# Patient Record
Sex: Male | Born: 1975 | Race: Black or African American | Hispanic: No | Marital: Single | Smoking: Current every day smoker
Health system: Southern US, Community
[De-identification: ages and names within clinical notes are randomized; demographics above are authoritative.]

## PROBLEM LIST (undated history)

## (undated) DIAGNOSIS — K4090 Unilateral inguinal hernia, without obstruction or gangrene, not specified as recurrent: Secondary | ICD-10-CM

## (undated) DIAGNOSIS — F17200 Nicotine dependence, unspecified, uncomplicated: Secondary | ICD-10-CM

## (undated) DIAGNOSIS — F1910 Other psychoactive substance abuse, uncomplicated: Secondary | ICD-10-CM

## (undated) DIAGNOSIS — IMO0001 Reserved for inherently not codable concepts without codable children: Secondary | ICD-10-CM

## (undated) HISTORY — PX: HERNIA REPAIR: SHX51

---

## 2008-11-10 ENCOUNTER — Emergency Department: Payer: Self-pay | Admitting: Emergency Medicine

## 2008-11-12 ENCOUNTER — Emergency Department: Payer: Self-pay | Admitting: Internal Medicine

## 2011-11-07 ENCOUNTER — Emergency Department: Payer: Self-pay | Admitting: Emergency Medicine

## 2019-12-29 ENCOUNTER — Emergency Department (HOSPITAL_COMMUNITY): Admission: EM | Admit: 2019-12-29 | Discharge: 2019-12-29 | Discharge status: Absent without leave | Payer: Self-pay

## 2019-12-29 ENCOUNTER — Other Ambulatory Visit: Payer: Self-pay

## 2020-08-15 ENCOUNTER — Other Ambulatory Visit: Payer: Self-pay

## 2020-08-15 ENCOUNTER — Emergency Department
Admission: EM | Admit: 2020-08-15 | Discharge: 2020-08-15 | Disposition: A | Discharge status: Home / Self Care | Payer: Self-pay | Attending: Emergency Medicine | Admitting: Emergency Medicine

## 2020-08-15 DIAGNOSIS — J111 Influenza due to unidentified influenza virus with other respiratory manifestations: Secondary | ICD-10-CM | POA: Insufficient documentation

## 2020-08-15 DIAGNOSIS — F17210 Nicotine dependence, cigarettes, uncomplicated: Secondary | ICD-10-CM | POA: Insufficient documentation

## 2020-08-15 DIAGNOSIS — Z20822 Contact with and (suspected) exposure to covid-19: Secondary | ICD-10-CM | POA: Insufficient documentation

## 2020-08-15 LAB — RESP PANEL BY RT-PCR (FLU A&B, COVID) ARPGX2
Influenza A by PCR: POSITIVE — AB
Influenza B by PCR: NEGATIVE
SARS Coronavirus 2 by RT PCR: NEGATIVE

## 2020-08-15 NOTE — ED Triage Notes (Signed)
Pt states the people who he lives with all tested positive for the flu, pt started to have body aches and congestion this morning. Pt denies pain or sob.

## 2020-08-15 NOTE — Discharge Instructions (Signed)
Take over-the-counter Tylenol or Motrin to help manage any fevers and body aches.  Take cough and allergy medicines as needed.  Follow-up local urgent care or community center for ongoing symptoms.

## 2020-08-15 NOTE — ED Provider Notes (Signed)
[facility] Emergency Department Provider Note ____________________________________________  Time seen: 2015  I have reviewed the triage vital signs and the nursing notes.  HISTORY  Chief Complaint  Influenza  HPI Tommy Hunt is a 45 y.o. male presents himself to the ED for evaluation of flulike symptoms.  Patient is home with several housemates who have tested positive for the flu.  Patient arrives complaining of body aches and congestion with onset this morning.  He denies any chest pain or shortness of breath.   History reviewed. No pertinent past medical history.  There are no problems to display for this patient.  History reviewed. No pertinent surgical history.  Prior to Admission medications   Not on File    Allergies Patient has no allergy information on record.  History reviewed. No pertinent family history.  Social History Social History   Tobacco Use  . Smoking status: Current Every Day Smoker    Types: Cigarettes  . Smokeless tobacco: Never Used  Substance Use Topics  . Alcohol use: Yes    Comment: occ  . Drug use: Yes    Types: Marijuana    Review of Systems  Constitutional: Negative for fever. Eyes: Negative for visual changes. ENT: Negative for sore throat.  Nasal congestion as above. Cardiovascular: Negative for chest pain. Respiratory: Negative for shortness of breath. Gastrointestinal: Negative for abdominal pain, vomiting and diarrhea. Genitourinary: Negative for dysuria. Musculoskeletal: Negative for back pain.  Reports generalized body aches. Skin: Negative for rash. Neurological: Negative for headaches, focal weakness or numbness. ____________________________________________  PHYSICAL EXAM:  VITAL SIGNS: ED Triage Vitals  Enc Vitals Group     BP 08/15/20 2005 (!) 176/83     Pulse Rate 08/15/20 2005 83     Resp 08/15/20 2005 18     Temp 08/15/20 2005 99.3 F (37.4 C)     Temp Source 08/15/20 2005 Oral      SpO2 08/15/20 2005 95 %     Weight 08/15/20 2003 200 lb (90.7 kg)     Height 08/15/20 2003 6' (1.829 m)     Head Circumference --      Peak Flow --      Pain Score 08/15/20 2003 0     Pain Loc --      Pain Edu? --      Excl. in GC? --     Constitutional: Alert and oriented. Well appearing and in no distress. Head: Normocephalic and atraumatic. Eyes: Conjunctivae are normal. Normal extraocular movements Cardiovascular: Normal rate, regular rhythm. Normal distal pulses. Respiratory: Normal respiratory effort. No wheezes/rales/rhonchi. Gastrointestinal: Soft and nontender. No distention. Musculoskeletal: Nontender with normal range of motion in all extremities.  Neurologic:  Normal gait without ataxia. Normal speech and language. No gross focal neurologic deficits are appreciated. Skin:  Skin is warm, dry and intact. No rash noted. Psychiatric: Mood and affect are normal. Patient exhibits appropriate insight and judgment. ____________________________________________   LABS (pertinent positives/negatives)  Labs Reviewed  RESP PANEL BY RT-PCR (FLU A&B, COVID) ARPGX2 - Abnormal; Notable for the following components:      Result Value   Influenza A by PCR POSITIVE (*)    All other components within normal limits  ____________________________________________  PROCEDURES  Procedures ____________________________________________   INITIAL IMPRESSION / ASSESSMENT AND PLAN / ED COURSE  As part of my medical decision making, I reviewed the following data within the electronic MEDICAL RECORD NUMBER Labs reviewed influenza A -positive and Notes from prior ED visits  Patient  with ED evaluation of symptoms concerning for influenza.  He presented last night with his housemates, who are all Conty positive for influenza.  He noted onset of his symptoms last night.  He describes body aches and chills.  He was evaluated for his complaints at this time and found to have a positive influenza screen.  He  is encounter treated symptoms with over-the-counter antipyretics and cough medicines as needed.  He is declined prescription for Tamiflu at this time.  Return precautions have been discussed.  Tommy Hunt was evaluated in Emergency Department on 08/15/2020 for the symptoms described in the history of present illness. He was evaluated in the context of the global COVID-19 pandemic, which necessitated consideration that the patient might be at risk for infection with the SARS-CoV-2 virus that causes COVID-19. Institutional protocols and algorithms that pertain to the evaluation of patients at risk for COVID-19 are in a state of rapid change based on information released by regulatory bodies including the CDC and federal and state organizations. These policies and algorithms were followed during the patient's care in the ED. ____________________________________________  FINAL CLINICAL IMPRESSION(S) / ED DIAGNOSES  Final diagnoses:  Influenza      Karmen Stabs, Charlesetta Ivory, PA-C 08/15/20 2111    Phineas Semen, MD 08/15/20 2125

## 2020-11-27 ENCOUNTER — Other Ambulatory Visit: Payer: Self-pay

## 2020-11-27 ENCOUNTER — Emergency Department: Payer: Self-pay

## 2020-11-27 ENCOUNTER — Emergency Department
Admission: EM | Admit: 2020-11-27 | Discharge: 2020-11-27 | Disposition: A | Discharge status: Home / Self Care | Payer: Self-pay | Attending: Emergency Medicine | Admitting: Emergency Medicine

## 2020-11-27 DIAGNOSIS — R062 Wheezing: Secondary | ICD-10-CM | POA: Insufficient documentation

## 2020-11-27 DIAGNOSIS — F17210 Nicotine dependence, cigarettes, uncomplicated: Secondary | ICD-10-CM | POA: Insufficient documentation

## 2020-11-27 DIAGNOSIS — R0602 Shortness of breath: Secondary | ICD-10-CM | POA: Insufficient documentation

## 2020-11-27 DIAGNOSIS — R079 Chest pain, unspecified: Secondary | ICD-10-CM | POA: Insufficient documentation

## 2020-11-27 LAB — BASIC METABOLIC PANEL
Anion gap: 6 (ref 5–15)
BUN: 16 mg/dL (ref 6–20)
CO2: 28 mmol/L (ref 22–32)
Calcium: 9 mg/dL (ref 8.9–10.3)
Chloride: 107 mmol/L (ref 98–111)
Creatinine, Ser: 1.07 mg/dL (ref 0.61–1.24)
GFR, Estimated: >60 mL/min (ref >60)
Glucose, Bld: 88 mg/dL (ref 70–99)
Potassium: 3.9 mmol/L (ref 3.5–5.1)
Sodium: 141 mmol/L (ref 135–145)

## 2020-11-27 LAB — CBC
HCT: 45.2 % (ref 39.0–52.0)
Hemoglobin: 15.3 g/dL (ref 13.0–17.0)
MCH: 30.2 pg (ref 26.0–34.0)
MCHC: 33.8 g/dL (ref 30.0–36.0)
MCV: 89.2 fL (ref 80.0–100.0)
Platelets: 187 10*3/uL (ref 150–400)
RBC: 5.07 MIL/uL (ref 4.22–5.81)
RDW: 14.1 % (ref 11.5–15.5)
WBC: 7.9 10*3/uL (ref 4.0–10.5)
nRBC: 0 % (ref 0.0–0.2)

## 2020-11-27 LAB — TROPONIN I (HIGH SENSITIVITY): Troponin I (High Sensitivity): 3 ng/L (ref <18)

## 2020-11-27 MED ORDER — ALBUTEROL SULFATE HFA 108 (90 BASE) MCG/ACT IN AERS: 1.0000 | INHALATION_SPRAY | RESPIRATORY_TRACT | 2 refills | Status: AC | PRN

## 2020-11-27 MED ORDER — IPRATROPIUM-ALBUTEROL 0.5-2.5 (3) MG/3ML IN SOLN
3.0000 mL | Freq: Once | RESPIRATORY_TRACT | Status: AC
  Administered 2020-11-27: 3 mL via RESPIRATORY_TRACT
  Filled 2020-11-27: qty 3

## 2020-11-27 MED ORDER — ALBUTEROL SULFATE (2.5 MG/3ML) 0.083% IN NEBU: 3.0000 mL | INHALATION_SOLUTION | Freq: Once | RESPIRATORY_TRACT | Status: DC

## 2020-11-27 MED ORDER — PREDNISONE 50 MG PO TABS: 50.0000 mg | ORAL_TABLET | Freq: Every day | ORAL | 0 refills | Status: AC

## 2020-11-27 MED ORDER — ALBUTEROL SULFATE HFA 108 (90 BASE) MCG/ACT IN AERS: 1.0000 | INHALATION_SPRAY | Freq: Once | RESPIRATORY_TRACT | Status: DC

## 2020-11-27 MED ORDER — ALBUTEROL SULFATE HFA 108 (90 BASE) MCG/ACT IN AERS
1.0000 | INHALATION_SPRAY | Freq: Once | RESPIRATORY_TRACT | Status: DC
  Filled 2020-11-27: qty 6.7

## 2020-11-27 MED ORDER — PREDNISONE 20 MG PO TABS
60.0000 mg | ORAL_TABLET | Freq: Once | ORAL | Status: AC
  Administered 2020-11-27: 60 mg via ORAL
  Filled 2020-11-27: qty 3

## 2020-11-27 NOTE — Discharge Instructions (Signed)
Please take the prednisone for the next 5 days.  You can use the albuterol inhaler every 4 hours as needed.  If your breathing is worsening, please return to the emergency department.  You likely have COPD, you should follow-up with your primary care provider or at one of the health clinics for further work-up and management.

## 2020-11-27 NOTE — ED Provider Notes (Signed)
[facility]  ____________________________________________   Event Date/Time   First MD Initiated Contact with Patient 11/27/20 1115     (approximate)  I have reviewed the triage vital signs and the nursing notes.   HISTORY  Chief Complaint Chest Pain and Shortness of Breath    HPI Basil Buffin is a 45 y.o. male history of heavy tobacco use who presents with shortness of breath and wheezing.  Patient woke up today around 3 AM and felt short of breath and felt he was wheezing.  He has had cough since then as well as congestion.  Prior to this was feeling okay.  Denies associated chest pain.  He has no prior history of COPD or asthma, but says he has used inhalers in the past but not currently.  Denies fevers or chills.  No sick exposures. The patient denies hx of prior DVT/PE, unilateral leg pain/swelling, hormone use, recent surgery, hx of cancer, prolonged immobilization, or hemoptysis.           History reviewed. No pertinent past medical history.  There are no problems to display for this patient.   History reviewed. No pertinent surgical history.  Prior to Admission medications   Medication Sig Start Date End Date Taking? Authorizing Provider  albuterol (VENTOLIN HFA) 108 (90 Base) MCG/ACT inhaler Inhale 1-2 puffs into the lungs every 4 (four) hours as needed for wheezing or shortness of breath. 11/27/20  Yes Georga Hacking, MD  predniSONE (DELTASONE) 50 MG tablet Take 1 tablet (50 mg total) by mouth daily for 5 days. 11/27/20 12/02/20 Yes Georga Hacking, MD    Allergies Patient has no allergy information on record.  No family history on file.  Social History Social History   Tobacco Use   Smoking status: Every Day    Types: Cigarettes   Smokeless tobacco: Never  Substance Use Topics   Alcohol use: Yes    Comment: occ   Drug use: Yes    Types: Marijuana    Review of Systems   Review of Systems  Constitutional:  Negative for  chills and fever.  Respiratory:  Positive for cough, shortness of breath and wheezing. Negative for chest tightness.   Cardiovascular:  Negative for chest pain, palpitations and leg swelling.  Gastrointestinal:  Negative for abdominal pain and vomiting.  All other systems reviewed and are negative.  Physical Exam Updated Vital Signs BP 123/82 (BP Location: Left Arm)   Pulse 74   Temp 98.5 F (36.9 C)   Resp 20   Ht 6' (1.829 m)   Wt 88.5 kg   SpO2 97%   BMI 26.45 kg/m   Physical Exam Vitals and nursing note reviewed.  Constitutional:      General: He is not in acute distress.    Appearance: Normal appearance.  HENT:     Head: Normocephalic and atraumatic.  Eyes:     General: No scleral icterus.    Conjunctiva/sclera: Conjunctivae normal.  Pulmonary:     Effort: Pulmonary effort is normal. No respiratory distress.     Breath sounds: Normal breath sounds. No wheezing.     Comments: Patient with expiratory wheezing, good air movement, no respiratory distress Musculoskeletal:        General: No deformity or signs of injury.     Cervical back: Normal range of motion.     Right lower leg: No edema.     Left lower leg: No edema.  Skin:    Coloration: Skin is  not jaundiced or pale.  Neurological:     General: No focal deficit present.     Mental Status: He is alert and oriented to person, place, and time. Mental status is at baseline.  Psychiatric:        Mood and Affect: Mood normal.        Behavior: Behavior normal.     LABS (all labs ordered are listed, but only abnormal results are displayed)  Labs Reviewed  BASIC METABOLIC PANEL  CBC  TROPONIN I (HIGH SENSITIVITY)   ____________________________________________  EKG  Rightward axis, normal sinus rhythm, normal intervals, no ST or T wave changes ____________________________________________  RADIOLOGY Ky Barban, personally viewed and evaluated these images (plain radiographs) as part of my medical  decision making, as well as reviewing the written report by the radiologist.  ED MD interpretation: I reviewed the chest x-ray which does not show any acute cardiopulmonary process    ____________________________________________   PROCEDURES  Procedure(s) performed (including Critical Care):  Procedures   ____________________________________________   INITIAL IMPRESSION / ASSESSMENT AND PLAN / ED COURSE     Patient is a 45 year old male with a history of tobacco use but no diagnosis of COPD or asthma who presents with shortness of breath and wheezing.  Onset today.  Vital signs within normal limits.  On my exam he is not in any respiratory distress, does have expiratory wheezing with good air movement.  Chest x-ray does not show any pneumonia or other in process.  His EKG is nonischemic.  Labs are reassuring with normal troponin.  Suspect that he has an underlying diagnosis of COPD given his tobacco use.  Will treat as COPD exacerbation.  Patient given duo nebs x2 and prednisone.  On repeat evaluation patient feeling much improved, no longer wheezing.  Will discharge with 5 days of prednisone and albuterol inhaler in hand.  We discussed following up with primary care provider or at one of the clinics as he likely has a diagnosis of COPD and should have follow-up for this.  Return precautions discussed as well.      ____________________________________________   FINAL CLINICAL IMPRESSION(S) / ED DIAGNOSES  Final diagnoses:  Wheezing  Shortness of breath     ED Discharge Orders          Ordered    predniSONE (DELTASONE) 50 MG tablet  Daily        11/27/20 1257    albuterol (VENTOLIN HFA) 108 (90 Base) MCG/ACT inhaler  Every 4 hours PRN        11/27/20 1257             Note:  This document was prepared using Dragon voice recognition software and may include unintentional dictation errors.    Georga Hacking, MD 11/27/20 1302

## 2020-11-27 NOTE — ED Triage Notes (Signed)
Pt comes with c/o SOb and CP that started last night. Pt states he went to work and his SOB all started again. Pt states wheezing. Pt denies any hx of this.

## 2021-03-14 ENCOUNTER — Emergency Department (HOSPITAL_COMMUNITY)
Admission: EM | Admit: 2021-03-14 | Discharge: 2021-03-14 | Disposition: A | Discharge status: Home / Self Care | Payer: Self-pay | Attending: Emergency Medicine | Admitting: Emergency Medicine

## 2021-03-14 ENCOUNTER — Encounter (HOSPITAL_COMMUNITY): Payer: Self-pay | Admitting: *Deleted

## 2021-03-14 ENCOUNTER — Other Ambulatory Visit: Payer: Self-pay

## 2021-03-14 DIAGNOSIS — U071 COVID-19: Secondary | ICD-10-CM | POA: Insufficient documentation

## 2021-03-14 DIAGNOSIS — F17210 Nicotine dependence, cigarettes, uncomplicated: Secondary | ICD-10-CM | POA: Insufficient documentation

## 2021-03-14 LAB — RESP PANEL BY RT-PCR (FLU A&B, COVID) ARPGX2
Influenza A by PCR: NEGATIVE
Influenza B by PCR: NEGATIVE
SARS Coronavirus 2 by RT PCR: POSITIVE — AB

## 2021-03-14 MED ORDER — IBUPROFEN 400 MG PO TABS
400.0000 mg | ORAL_TABLET | Freq: Once | ORAL | Status: AC
  Administered 2021-03-14: 400 mg via ORAL
  Filled 2021-03-14: qty 1

## 2021-03-14 NOTE — Discharge Instructions (Signed)
You have tested positive for COVID and you will need to stay home to avoid spreading this infection through Sunday.  As long as you are feeling better and have had no fevers for 24 hours it will be okay for you to return to work on Monday.  Rest to make sure you are drinking plenty of fluids.  I recommend taking Tylenol or ibuprofen to help you with fever, body aches.  Return here if you develop any worsening symptoms including weakness, dizziness or shortness of breath.

## 2021-03-14 NOTE — ED Triage Notes (Signed)
Pt c/o body aches with chills that started  this am

## 2021-03-14 NOTE — ED Provider Notes (Signed)
[facility] EMERGENCY DEPARTMENT Provider Note   CSN: [id number] Arrival date & time: 03/14/21  1412     History Chief Complaint  Patient presents with   Generalized Body Aches    Tommy Hunt is a 45 y.o. male with no significant past medical history presenting with a 1 day history of flulike symptoms including generalized body aches, headache and clear nasal drainage.  He denies cough or shortness of breath, he has had subjective fever including chills.  He denies chest pain, nausea, vomiting or abdominal pain.  He has had no treatment prior to arrival but states he left work early and went home and took a nap and his headache has resolved but he still has body aches.  He has been fully COVID vaccinated but did not receive the flu shot this fall.  The history is provided by the patient.      History reviewed. No pertinent past medical history.  There are no problems to display for this patient.   Past Surgical History:  Procedure Laterality Date   HERNIA REPAIR         History reviewed. No pertinent family history.  Social History   Tobacco Use   Smoking status: Every Day    Packs/day: 0.50    Types: Cigarettes   Smokeless tobacco: Never  Substance Use Topics   Alcohol use: Yes    Comment: occ   Drug use: Yes    Types: Marijuana    Home Medications Prior to Admission medications   Medication Sig Start Date End Date Taking? Authorizing Provider  albuterol (VENTOLIN HFA) 108 (90 Base) MCG/ACT inhaler Inhale 1-2 puffs into the lungs every 4 (four) hours as needed for wheezing or shortness of breath. 11/27/20   Georga Hacking, MD    Allergies    Patient has no known allergies.  Review of Systems   Review of Systems  Constitutional:  Positive for chills and fever.  HENT:  Positive for rhinorrhea. Negative for congestion, ear pain, sinus pressure, sore throat, trouble swallowing and voice change.   Eyes:  Negative for discharge.  Respiratory:  Negative  for cough, shortness of breath, wheezing and stridor.   Cardiovascular:  Negative for chest pain.  Gastrointestinal:  Negative for abdominal pain, diarrhea, nausea and vomiting.  Genitourinary: Negative.   Musculoskeletal:  Positive for myalgias.  All other systems reviewed and are negative.  Physical Exam Updated Vital Signs BP 118/79 (BP Location: Right Arm)   Pulse 65   Temp 98.8 F (37.1 C)   Resp 15   Ht 6\' 1"  (1.854 m)   Wt 86.2 kg   SpO2 96%   BMI 25.07 kg/m   Physical Exam Vitals and nursing note reviewed.  Constitutional:      Appearance: He is well-developed.  HENT:     Head: Normocephalic and atraumatic.     Nose: Rhinorrhea present.  Eyes:     Conjunctiva/sclera: Conjunctivae normal.  Cardiovascular:     Rate and Rhythm: Normal rate and regular rhythm.     Heart sounds: Normal heart sounds.  Pulmonary:     Effort: Pulmonary effort is normal.     Breath sounds: Normal breath sounds. No wheezing.  Abdominal:     General: Bowel sounds are normal.     Palpations: Abdomen is soft.     Tenderness: There is no abdominal tenderness.  Musculoskeletal:        General: Normal range of motion.     Cervical back: Normal range  of motion.  Skin:    General: Skin is warm and dry.  Neurological:     General: No focal deficit present.     Mental Status: He is alert.    ED Results / Procedures / Treatments   Labs (all labs ordered are listed, but only abnormal results are displayed) Labs Reviewed  RESP PANEL BY RT-PCR (FLU A&B, COVID) ARPGX2 - Abnormal; Notable for the following components:      Result Value   SARS Coronavirus 2 by RT PCR POSITIVE (*)    All other components within normal limits    EKG None  Radiology No results found.  Procedures Procedures   Medications Ordered in ED Medications  ibuprofen (ADVIL) tablet 400 mg (400 mg Oral Given 03/14/21 1528)    ED Course  I have reviewed the triage vital signs and the nursing notes.  Pertinent  labs & imaging results that were available during my care of the patient were reviewed by me and considered in my medical decision making (see chart for details).    MDM Rules/Calculators/A&P                           Patient is positive for COVID, he appears stable on exam, he has no complaints of shortness of breath, dizziness.  His vital signs have been stable here.  He is low risk for complications, not a candidate for paxlovid.  He was given home instructions for symptom relief.  Return precautions were outlined. Final Clinical Impression(s) / ED Diagnoses Final diagnoses:  COVID-19    Rx / DC Orders ED Discharge Orders     None        Victoriano Lain 03/14/21 1729    Bethann Berkshire, MD 03/14/21 2255

## 2021-06-06 ENCOUNTER — Other Ambulatory Visit: Payer: Self-pay

## 2021-06-06 ENCOUNTER — Emergency Department (HOSPITAL_COMMUNITY)
Admission: EM | Admit: 2021-06-06 | Discharge: 2021-06-06 | Disposition: A | Discharge status: Home / Self Care | Payer: Self-pay | Attending: Emergency Medicine | Admitting: Emergency Medicine

## 2021-06-06 ENCOUNTER — Emergency Department (HOSPITAL_COMMUNITY): Payer: Self-pay

## 2021-06-06 DIAGNOSIS — Z8616 Personal history of COVID-19: Secondary | ICD-10-CM | POA: Insufficient documentation

## 2021-06-06 DIAGNOSIS — Z20822 Contact with and (suspected) exposure to covid-19: Secondary | ICD-10-CM | POA: Insufficient documentation

## 2021-06-06 DIAGNOSIS — J069 Acute upper respiratory infection, unspecified: Secondary | ICD-10-CM | POA: Insufficient documentation

## 2021-06-06 LAB — RESP PANEL BY RT-PCR (FLU A&B, COVID) ARPGX2
Influenza A by PCR: NEGATIVE
Influenza B by PCR: NEGATIVE
SARS Coronavirus 2 by RT PCR: NEGATIVE

## 2021-06-06 MED ORDER — IPRATROPIUM-ALBUTEROL 0.5-2.5 (3) MG/3ML IN SOLN
3.0000 mL | Freq: Once | RESPIRATORY_TRACT | Status: AC
  Administered 2021-06-06: 3 mL via RESPIRATORY_TRACT
  Filled 2021-06-06: qty 3

## 2021-06-06 MED ORDER — ALBUTEROL SULFATE HFA 108 (90 BASE) MCG/ACT IN AERS
2.0000 | INHALATION_SPRAY | RESPIRATORY_TRACT | Status: DC | PRN
  Filled 2021-06-06: qty 6.7

## 2021-06-06 MED ORDER — PREDNISONE 10 MG PO TABS: 30.0000 mg | ORAL_TABLET | Freq: Every day | ORAL | 0 refills | Status: AC

## 2021-06-06 NOTE — Discharge Instructions (Signed)
Likely a viral infection, recommend over-the-counter pain medications like ibuprofen Tylenol for fever and pain control, nasal decongestions like Flonase and Zyrtec, Mucinex for cough.  If not eating recommend supplementing with Gatorade to help with electrolyte supplementation. ? ?given you inhaler please use every 4-6 hours 1 to 2 puffs as needed for shortness of breath, as well assteroids please take as prescribed.  To help with some of your wheezing I recommend using humidifier or sitting in your shower and breathing in the moist air. ? ?Follow-up PCP for further evaluation. ? ?Come back to the emergency department if you develop chest pain, shortness of breath, severe abdominal pain, uncontrolled nausea, vomiting, diarrhea. ? ?

## 2021-06-06 NOTE — ED Triage Notes (Signed)
Cough, SOB since "Friday". Productive cough with yellow sputum. Denies vomiting or diarrhea. Unsure of fever. ?

## 2021-06-06 NOTE — ED Provider Notes (Signed)
[facility] EMERGENCY DEPARTMENT Provider Note   CSN: [id number] Arrival date & time: 06/06/21  1254     History  Chief Complaint  Patient presents with   Shortness of Breath   Cough    Tommy Hunt is a 46 y.o. male.  HPI  Patient without significant medical history presents with complaints of URI-like symptoms.  Patient states that symptoms started on Friday, he endorses subjective fevers chills nasal congestion and a productive cough, states he has been coughing up greenish sputum, has had decrease in appetite, no stomach pains nausea vomiting diarrhea no general body aches.  He is not immunocompromise, denies any recent sick contacts, did not obtain his influenza vaccine but did in fact his COVID-vaccine.  States he had COVID 1 month ago.  Patient is a current smoker, endorses some wheezing but denies any shortness of breath or pleuritic chest pain, no history of PEs or DVTs currently not on hormone therapy.  I have reviewed patient's chart was seen back in December was diagnosed with COVID discharged without antiviral treatment.  Home Medications Prior to Admission medications   Medication Sig Start Date End Date Taking? Authorizing Provider  predniSONE (DELTASONE) 10 MG tablet Take 3 tablets (30 mg total) by mouth daily for 5 days. 06/06/21 06/11/21 Yes Carroll Sage, PA-C  albuterol (VENTOLIN HFA) 108 (90 Base) MCG/ACT inhaler Inhale 1-2 puffs into the lungs every 4 (four) hours as needed for wheezing or shortness of breath. Patient not taking: Reported on 06/06/2021 11/27/20   Georga Hacking, MD      Allergies    Patient has no known allergies.    Review of Systems   Review of Systems  Constitutional:  Positive for appetite change, chills and fever.  HENT:  Positive for congestion. Negative for sore throat.   Respiratory:  Positive for cough and wheezing. Negative for shortness of breath.   Cardiovascular:  Negative for chest pain.  Gastrointestinal:  Negative for  abdominal pain, diarrhea, nausea and vomiting.  Musculoskeletal:  Negative for myalgias.  Neurological:  Negative for headaches.   Physical Exam Updated Vital Signs BP 117/69    Pulse 87    Temp 98.9 F (37.2 C) (Oral)    Resp 20    Ht 6\' 1"  (1.854 m)    Wt 83.9 kg    SpO2 93%    BMI 24.41 kg/m  Physical Exam Vitals and nursing note reviewed.  Constitutional:      General: He is not in acute distress.    Appearance: He is not ill-appearing.  HENT:     Head: Normocephalic and atraumatic.     Nose: No congestion.  Eyes:     Conjunctiva/sclera: Conjunctivae normal.  Cardiovascular:     Rate and Rhythm: Normal rate and regular rhythm.     Pulses: Normal pulses.     Heart sounds: No murmur heard.   No friction rub. No gallop.  Pulmonary:     Effort: No respiratory distress.     Breath sounds: Wheezing present. No rhonchi or rales.     Comments: Patient was not in respiratory distress on my exam, he is resting calmly, he was nontachypneic nonhypoxic, able to speak in full sentences, patient had a coarse sounding lung sounds with expiratory wheezing no rails or rhonchi present. Abdominal:     Palpations: Abdomen is soft.     Tenderness: There is no abdominal tenderness. There is no right CVA tenderness or left CVA tenderness.  Musculoskeletal:  Right lower leg: No edema.     Left lower leg: No edema.  Skin:    General: Skin is warm and dry.  Neurological:     Mental Status: He is alert.  Psychiatric:        Mood and Affect: Mood normal.    ED Results / Procedures / Treatments   Labs (all labs ordered are listed, but only abnormal results are displayed) Labs Reviewed  RESP PANEL BY RT-PCR (FLU A&B, COVID) [id number]    EKG EKG Interpretation  Date/Time:  Wednesday June 06 2021 13:05:23 EST Ventricular Rate:  88 PR Interval:  148 QRS Duration: 85 QT Interval:  361 QTC Calculation: 437 R Axis:   71 Text Interpretation: Sinus rhythm since last tracing no significant  change Confirmed by Eber Hong ([id number]) on 06/06/2021 1:22:32 PM  Radiology DG Chest 2 View  Result Date: 06/06/2021 CLINICAL DATA:  Shortness of breath. EXAM: CHEST - 2 VIEW COMPARISON:  November 27, 2020. FINDINGS: The heart size and mediastinal contours are within normal limits. Both lungs are clear. The visualized skeletal structures are unremarkable. IMPRESSION: No active cardiopulmonary disease. Electronically Signed   By: Lupita Raider M.D.   On: 06/06/2021 13:29    Procedures Procedures    Medications Ordered in ED Medications  albuterol (VENTOLIN HFA) 108 (90 Base) MCG/ACT inhaler 2 puff (has no administration in time range)  ipratropium-albuterol (DUONEB) 0.5-2.5 (3) MG/3ML nebulizer solution 3 mL (3 mLs Nebulization Given 06/06/21 1411)    ED Course/ Medical Decision Making/ A&P                           Medical Decision Making Amount and/or Complexity of Data Reviewed Radiology: ordered.  Risk Prescription drug management.   This patient presents to the ED for concern of URI, this involves an extensive number of treatment options, and is a complaint that carries with it a high risk of complications and morbidity.  The differential diagnosis includes pneumonia, PE, sepsis    Additional history obtained:  Additional history obtained from electronic medical record External records from outside source obtained and reviewed including please see HPI   Co morbidities that complicate the patient evaluation  Nicotine user  Social Determinants of Health:  N/A    Lab Tests:  I Ordered, and personally interpreted labs.  The pertinent results include: Respiratory panel pending   Imaging Studies ordered:  I ordered imaging studies including chest x-ray I independently visualized and interpreted imaging which showed negative for acute findings I agree with the radiologist interpretation   Cardiac Monitoring:  The patient was maintained on a cardiac monitor.  I  personally viewed and interpreted the cardiac monitored which showed an underlying rhythm of: EKG without signs of ischemia   Medicines ordered and prescription drug management:  I ordered medication including DuoNeb for wheezing I have reviewed the patients home medicines and have made adjustments as needed     Reevaluation: During my exam patient had notable wheezing, will provide with a DuoNeb and reassess.  Was reassessed found resting comfortably, vital signs are reassuring, lung sounds were reassessed not showing any signs of respiratory distress nontachypneic nonhypoxic able to speak in full sentences, patient still has coarse sounding lung sounds but they have improved from prior exam, only minor expiratory wheezing present.  He is agreeable for discharge at this time.   Rule out Low suspicion for systemic infection as patient is nontoxic-appearing, vital signs reassuring,  no obvious source infection noted on exam.  Low suspicion for pneumonia as lung sounds are clear bilaterally, x-ray did not reveal any acute findings.  I have low suspicion for PE as patient denies pleuritic chest pain, shortness of breath, patient is PERC. low suspicion for strep throat as oropharynx was visualized, no erythema or exudates noted.  Low suspicion patient would need  hospitalized due to viral infection or Covid as vital signs reassuring, patient is not in respiratory distress.      Dispostion and problem list  After consideration of the diagnostic results and the patients response to treatment, I feel that the patent would benefit from discharge.  URI-likely viral nature, will defer on antiviral treatments as he has very mild symptoms, he is not immunocompromise, patient has low risk factors for adverse outcome.  Will recommend symptom management, will provide him with a short course of steroids as well as bronchodilators as he was wheezing my exam.  Follow-up with PCP.            Final  Clinical Impression(s) / ED Diagnoses Final diagnoses:  Viral URI with cough    Rx / DC Orders ED Discharge Orders          Ordered    predniSONE (DELTASONE) 10 MG tablet  Daily        06/06/21 1445              Carroll Sage, PA-C 06/06/21 1446    Eber Hong, MD 06/08/21 [phone]

## 2021-06-06 NOTE — ED Triage Notes (Signed)
Pt reports he was positive for COVID one month ago. ?

## 2021-08-13 ENCOUNTER — Emergency Department (HOSPITAL_COMMUNITY)
Admission: EM | Admit: 2021-08-13 | Discharge: 2021-08-13 | Disposition: A | Discharge status: Home / Self Care | Payer: Self-pay | Attending: Student | Admitting: Student

## 2021-08-13 ENCOUNTER — Encounter (HOSPITAL_COMMUNITY): Payer: Self-pay | Admitting: *Deleted

## 2021-08-13 DIAGNOSIS — F17210 Nicotine dependence, cigarettes, uncomplicated: Secondary | ICD-10-CM | POA: Insufficient documentation

## 2021-08-13 DIAGNOSIS — T402X1A Poisoning by other opioids, accidental (unintentional), initial encounter: Secondary | ICD-10-CM | POA: Insufficient documentation

## 2021-08-13 DIAGNOSIS — R7309 Other abnormal glucose: Secondary | ICD-10-CM | POA: Insufficient documentation

## 2021-08-13 LAB — SALICYLATE LEVEL: Salicylate Lvl: <7 mg/dL — ABNORMAL LOW (ref 7.0–30.0)

## 2021-08-13 LAB — COMPREHENSIVE METABOLIC PANEL
ALT: 20 U/L (ref 0–44)
AST: 20 U/L (ref 15–41)
Albumin: 4 g/dL (ref 3.5–5.0)
Alkaline Phosphatase: 52 U/L (ref 38–126)
Anion gap: 8 (ref 5–15)
BUN: 15 mg/dL (ref 6–20)
CO2: 28 mmol/L (ref 22–32)
Calcium: 8.6 mg/dL — ABNORMAL LOW (ref 8.9–10.3)
Chloride: 105 mmol/L (ref 98–111)
Creatinine, Ser: 1.05 mg/dL (ref 0.61–1.24)
GFR, Estimated: >60 mL/min (ref >60)
Glucose, Bld: 133 mg/dL — ABNORMAL HIGH (ref 70–99)
Potassium: 2.9 mmol/L — ABNORMAL LOW (ref 3.5–5.1)
Sodium: 141 mmol/L (ref 135–145)
Total Bilirubin: 0.8 mg/dL (ref 0.3–1.2)
Total Protein: 7.8 g/dL (ref 6.5–8.1)

## 2021-08-13 LAB — CBG MONITORING, ED: Glucose-Capillary: 138 mg/dL — ABNORMAL HIGH (ref 70–99)

## 2021-08-13 LAB — CBC WITH DIFFERENTIAL/PLATELET
Abs Immature Granulocytes: 0.06 10*3/uL (ref 0.00–0.07)
Basophils Absolute: 0.1 10*3/uL (ref 0.0–0.1)
Basophils Relative: 1 %
Eosinophils Absolute: 0.1 10*3/uL (ref 0.0–0.5)
Eosinophils Relative: 1 %
HCT: 41.6 % (ref 39.0–52.0)
Hemoglobin: 13.8 g/dL (ref 13.0–17.0)
Immature Granulocytes: 1 %
Lymphocytes Relative: 32 %
Lymphs Abs: 2.7 10*3/uL (ref 0.7–4.0)
MCH: 29.5 pg (ref 26.0–34.0)
MCHC: 33.2 g/dL (ref 30.0–36.0)
MCV: 88.9 fL (ref 80.0–100.0)
Monocytes Absolute: 0.5 10*3/uL (ref 0.1–1.0)
Monocytes Relative: 6 %
Neutro Abs: 5.1 10*3/uL (ref 1.7–7.7)
Neutrophils Relative %: 59 %
Platelets: 178 10*3/uL (ref 150–400)
RBC: 4.68 MIL/uL (ref 4.22–5.81)
RDW: 13.8 % (ref 11.5–15.5)
WBC: 8.6 10*3/uL (ref 4.0–10.5)
nRBC: 0 % (ref 0.0–0.2)

## 2021-08-13 LAB — RAPID URINE DRUG SCREEN, HOSP PERFORMED
Amphetamines: NOT DETECTED
Barbiturates: NOT DETECTED
Benzodiazepines: NOT DETECTED
Cocaine: NOT DETECTED
Opiates: NOT DETECTED
Tetrahydrocannabinol: NOT DETECTED

## 2021-08-13 LAB — ACETAMINOPHEN LEVEL: Acetaminophen (Tylenol), Serum: <10 ug/mL — ABNORMAL LOW (ref 10–30)

## 2021-08-13 LAB — ETHANOL: Alcohol, Ethyl (B): <10 mg/dL (ref <10)

## 2021-08-13 MED ORDER — POTASSIUM CHLORIDE CRYS ER 20 MEQ PO TBCR
40.0000 meq | EXTENDED_RELEASE_TABLET | Freq: Once | ORAL | Status: AC
  Administered 2021-08-13: 40 meq via ORAL
  Filled 2021-08-13: qty 2

## 2021-08-13 MED ORDER — ONDANSETRON HCL 4 MG/2ML IJ SOLN
4.0000 mg | Freq: Once | INTRAMUSCULAR | Status: AC
  Administered 2021-08-13: 4 mg via INTRAVENOUS
  Filled 2021-08-13: qty 2

## 2021-08-13 MED ORDER — NALOXONE HCL 2 MG/2ML IJ SOSY
1.0000 mg | PREFILLED_SYRINGE | INTRAMUSCULAR | Status: DC | PRN
  Administered 2021-08-13: 1 mg via INTRAVENOUS
  Filled 2021-08-13: qty 2

## 2021-08-13 MED ORDER — MAGNESIUM OXIDE -MG SUPPLEMENT 400 (240 MG) MG PO TABS
800.0000 mg | ORAL_TABLET | Freq: Once | ORAL | Status: AC
  Administered 2021-08-13: 800 mg via ORAL
  Filled 2021-08-13: qty 2

## 2021-08-13 NOTE — ED Notes (Signed)
EDP at [facility]. Pt moving from h/w to room 18. Family back to room.  ?

## 2021-08-13 NOTE — ED Notes (Signed)
Pt improved after narcan. EDP at [facility]. Pt alert, NAD, calm, interactive, participatory. VSS.  ?

## 2021-08-13 NOTE — ED Notes (Signed)
SPO2 100% on 2L. Trialing pt on RA per request. [place] removed. Pt alert, NAD, calm, interactive, visiting with family at [facility]. Pt able to maintain conversation.  ?

## 2021-08-13 NOTE — ED Triage Notes (Signed)
Pt brought in by [employer] for c/o unconscious on job; ems reports pt will arouse to verbal stimuli;  ? ?BP 148/83 ?Cbg 137 ? ?Pt does desat during episodes of unresponsiveness; pt denies any illicit drugs and has pinpoint pupils  ? ?Pt denies any drug use and states he doesn't know what happened ?

## 2021-08-13 NOTE — ED Provider Notes (Signed)
?[facility] EMERGENCY DEPARTMENT ?Provider Note ? ?CSN: [id number] ?Arrival date & time: 08/13/21 1501 ? ?Chief Complaint(s) ?unresponsive ? ?HPI ?Tommy Hunt is a 46 y.o. male with no significant PMH, no history of polysubstance use who presents emergency department for evaluation of altered mental status.  Patient states that at work a Radio broadcast assistant gave him "3 Tylenols" and he woke up here in the emergency department.  There were reports that the patient was falling asleep on the job prompting EMS evaluation and emergency department evaluation.  Patient received no medication prior to arrival.  On arrival, patient is falling asleep during initial evaluation but is arousable to loud sounds.  No appreciable respiratory pression or external signs of trauma. ? ? ?Past Medical History ?History reviewed. No pertinent past medical history. ?There are no problems to display for this patient. ? ?Home Medication(s) ?Prior to Admission medications   ?Medication Sig Start Date End Date Taking? Authorizing Provider  ?albuterol (VENTOLIN HFA) 108 (90 Base) MCG/ACT inhaler Inhale 1-2 puffs into the lungs every 4 (four) hours as needed for wheezing or shortness of breath. ?Patient not taking: Reported on 06/06/2021 11/27/20   Georga Hacking, MD  ?                                                                                                                                  ?Past Surgical History ?Past Surgical History:  ?Procedure Laterality Date  ? HERNIA REPAIR    ? ?Family History ?History reviewed. No pertinent family history. ? ?Social History ?Social History  ? ?Tobacco Use  ? Smoking status: Every Day  ?  Packs/day: 0.50  ?  Types: Cigarettes  ? Smokeless tobacco: Never  ?Substance Use Topics  ? Alcohol use: Yes  ?  Comment: occ  ? Drug use: Yes  ?  Types: Marijuana  ? ?Allergies ?Patient has no known allergies. ? ?Review of Systems ?Review of Systems  ?Constitutional:  Positive for activity change.  ? ?Physical Exam ?Vital  Signs  ?I have reviewed the triage vital signs ?BP 127/86 (BP Location: Left Arm)   Pulse 84   Temp (!) 97.4 ?F (36.3 ?C) (Oral)   Resp 17   SpO2 99%  ? ?Physical Exam ?Vitals and nursing note reviewed.  ?Constitutional:   ?   General: He is not in acute distress. ?   Appearance: He is well-developed.  ?   Comments: Somnolent, falling asleep on my evaluation  ?HENT:  ?   Head: Normocephalic and atraumatic.  ?Eyes:  ?   Conjunctiva/sclera: Conjunctivae normal.  ?Cardiovascular:  ?   Rate and Rhythm: Normal rate and regular rhythm.  ?   Heart sounds: No murmur heard. ?Pulmonary:  ?   Effort: Pulmonary effort is normal. No respiratory distress.  ?   Breath sounds: Normal breath sounds.  ?Abdominal:  ?   Palpations: Abdomen is soft.  ?   Tenderness: There is no abdominal  tenderness.  ?Musculoskeletal:     ?   General: No swelling.  ?   Cervical back: Neck supple.  ?Skin: ?   General: Skin is warm and dry.  ?   Capillary Refill: Capillary refill takes less than 2 seconds.  ?Neurological:  ?   Mental Status: He is alert.  ?Psychiatric:     ?   Mood and Affect: Mood normal.  ? ? ?ED Results and Treatments ?Labs ?(all labs ordered are listed, but only abnormal results are displayed) ?Labs Reviewed  ?COMPREHENSIVE METABOLIC PANEL - Abnormal; Notable for the following components:  ?    Result Value  ? Potassium 2.9 (*)   ? Glucose, Bld 133 (*)   ? Calcium 8.6 (*)   ? All other components within normal limits  ?ACETAMINOPHEN LEVEL - Abnormal; Notable for the following components:  ? Acetaminophen (Tylenol), Serum <10 (*)   ? All other components within normal limits  ?SALICYLATE LEVEL - Abnormal; Notable for the following components:  ? Salicylate Lvl <7.0 (*)   ? All other components within normal limits  ?CBG MONITORING, ED - Abnormal; Notable for the following components:  ? Glucose-Capillary 138 (*)   ? All other components within normal limits  ?CBC WITH DIFFERENTIAL/PLATELET  ?RAPID URINE DRUG SCREEN, HOSP PERFORMED   ?ETHANOL  ?                                                                                                                       ? ?Radiology ?No results found. ? ?Pertinent labs & imaging results that were available during my care of the patient were reviewed by me and considered in my medical decision making (see MDM for details). ? ?Medications Ordered in ED ?Medications  ?ondansetron (ZOFRAN) injection 4 mg (4 mg Intravenous Given 08/13/21 1537)  ?potassium chloride SA (KLOR-CON M) CR tablet 40 mEq (40 mEq Oral Given 08/13/21 1710)  ?magnesium oxide (MAG-OX) tablet 800 mg (800 mg Oral Given 08/13/21 1710)  ?                                                               ?                                                                    ?Procedures ?Procedures ? ?(including critical care time) ? ?Medical Decision Making / ED Course ? ? ?This patient presents to the ED for concern of lack of responsiveness, this involves an extensive number of treatment options, and is a complaint  that carries with it a high risk of complications and morbidity.  The differential diagnosis includes accidental opioid overdose, alcohol intoxication, electrolyte abnormality, encephalopathy ? ?MDM: ?Patient seen emergency room for evaluation of unresponsiveness.  Physical exam reveals a patient actively falling asleep during my exam and somnolent.  Patient is arousable to loud stimuli.  Laboratory evaluation with a potassium of 2.9, initial POC blood glucose 138, aspirin Tylenol negative.  UDS negative.  However, patient received 1 mg of IV Narcan and mental status improved significantly.  Electrolytes repleted.  Patient observed in the emergency department for approximately 2 hours and he did not have return of his symptoms.  Patient likely suffered a accidental opioid overdose today and as he has been appropriately reversed he is safe for discharge with outpatient follow-up.  He was given return precautions which he voiced understanding  and was discharged. ? ? ?Additional history obtained: ? ?-External records from outside source obtained and reviewed including: Chart review including previous notes, labs, imaging, consultation notes ? ? ?Lab Tests: ?-I ordered, reviewed, and interpreted labs.   ?The pertinent results include:   ?Labs Reviewed  ?COMPREHENSIVE METABOLIC PANEL - Abnormal; Notable for the following components:  ?    Result Value  ? Potassium 2.9 (*)   ? Glucose, Bld 133 (*)   ? Calcium 8.6 (*)   ? All other components within normal limits  ?ACETAMINOPHEN LEVEL - Abnormal; Notable for the following components:  ? Acetaminophen (Tylenol), Serum <10 (*)   ? All other components within normal limits  ?SALICYLATE LEVEL - Abnormal; Notable for the following components:  ? Salicylate Lvl <7.0 (*)   ? All other components within normal limits  ?CBG MONITORING, ED - Abnormal; Notable for the following components:  ? Glucose-Capillary 138 (*)   ? All other components within normal limits  ?CBC WITH DIFFERENTIAL/PLATELET  ?RAPID URINE DRUG SCREEN, HOSP PERFORMED  ?ETHANOL  ?  ? ?Medicines ordered and prescription drug management: ?Meds ordered this encounter  ?Medications  ? DISCONTD: naloxone (NARCAN) injection 1 mg  ? ondansetron (ZOFRAN) injection 4 mg  ? potassium chloride SA (KLOR-CON M) CR tablet 40 mEq  ? magnesium oxide (MAG-OX) tablet 800 mg  ?  ?-I have reviewed the patients home medicines and have made adjustments as needed ? ?Critical interventions ?none ? ? ?Cardiac Monitoring: ?The patient was maintained on a cardiac monitor.  I personally viewed and interpreted the cardiac monitored which showed an underlying rhythm of: Sinus rhythm ? ?Social Determinants of Health:  ?Factors impacting patients care include: none ? ? ?Reevaluation: ?After the interventions noted above, I reevaluated the patient and found that they have :improved ? ?Co morbidities that complicate the patient evaluation ?History reviewed. No pertinent past medical  history.  ? ? ?Dispostion: ?I considered admission for this patient, and his symptoms have resolved and he has been appropriately reversed with no return of symptoms he is safe for discharge with outpatie

## 2021-08-13 NOTE — ED Notes (Addendum)
Pt alert, but drifts off back to sleep, unable to maintain conversation. NAD, calm, interactive, resps e/u, speaking in clear complete sentences. Pt speaking on phone, but falls aleep.  ?

## 2021-09-18 ENCOUNTER — Encounter: Payer: Self-pay | Admitting: Emergency Medicine

## 2021-09-18 ENCOUNTER — Emergency Department
Admission: EM | Admit: 2021-09-18 | Discharge: 2021-09-18 | Disposition: A | Discharge status: Home / Self Care | Payer: Self-pay | Attending: Emergency Medicine | Admitting: Emergency Medicine

## 2021-09-18 ENCOUNTER — Other Ambulatory Visit: Payer: Self-pay

## 2021-09-18 DIAGNOSIS — T40604A Poisoning by unspecified narcotics, undetermined, initial encounter: Secondary | ICD-10-CM

## 2021-09-18 DIAGNOSIS — T402X1A Poisoning by other opioids, accidental (unintentional), initial encounter: Secondary | ICD-10-CM | POA: Insufficient documentation

## 2021-09-18 DIAGNOSIS — N179 Acute kidney failure, unspecified: Secondary | ICD-10-CM

## 2021-09-18 DIAGNOSIS — R7989 Other specified abnormal findings of blood chemistry: Secondary | ICD-10-CM | POA: Insufficient documentation

## 2021-09-18 DIAGNOSIS — D72829 Elevated white blood cell count, unspecified: Secondary | ICD-10-CM | POA: Insufficient documentation

## 2021-09-18 LAB — CBC WITH DIFFERENTIAL/PLATELET
Abs Immature Granulocytes: 0.74 10*3/uL — ABNORMAL HIGH (ref 0.00–0.07)
Basophils Absolute: 0.1 10*3/uL (ref 0.0–0.1)
Basophils Relative: 0 %
Eosinophils Absolute: 0.1 10*3/uL (ref 0.0–0.5)
Eosinophils Relative: 0 %
HCT: 44.7 % (ref 39.0–52.0)
Hemoglobin: 14.1 g/dL (ref 13.0–17.0)
Immature Granulocytes: 5 %
Lymphocytes Relative: 6 %
Lymphs Abs: 0.9 10*3/uL (ref 0.7–4.0)
MCH: 28.4 pg (ref 26.0–34.0)
MCHC: 31.5 g/dL (ref 30.0–36.0)
MCV: 89.9 fL (ref 80.0–100.0)
Monocytes Absolute: 0.3 10*3/uL (ref 0.1–1.0)
Monocytes Relative: 2 %
Neutro Abs: 13.8 10*3/uL — ABNORMAL HIGH (ref 1.7–7.7)
Neutrophils Relative %: 87 %
Platelets: 233 10*3/uL (ref 150–400)
RBC: 4.97 MIL/uL (ref 4.22–5.81)
RDW: 14 % (ref 11.5–15.5)
WBC: 15.8 10*3/uL — ABNORMAL HIGH (ref 4.0–10.5)
nRBC: 0 % (ref 0.0–0.2)

## 2021-09-18 LAB — BASIC METABOLIC PANEL
Anion gap: 10 (ref 5–15)
BUN: 12 mg/dL (ref 6–20)
CO2: 29 mmol/L (ref 22–32)
Calcium: 8.7 mg/dL — ABNORMAL LOW (ref 8.9–10.3)
Chloride: 102 mmol/L (ref 98–111)
Creatinine, Ser: 1.78 mg/dL — ABNORMAL HIGH (ref 0.61–1.24)
GFR, Estimated: 48 mL/min — ABNORMAL LOW (ref >60)
Glucose, Bld: 265 mg/dL — ABNORMAL HIGH (ref 70–99)
Potassium: 5 mmol/L (ref 3.5–5.1)
Sodium: 141 mmol/L (ref 135–145)

## 2021-09-18 NOTE — ED Notes (Signed)
Purple, Red, Green, and Blue tops sent down to lab

## 2021-09-18 NOTE — ED Triage Notes (Signed)
Pt to ED via [facility] for drug overdose, pt was found unresponsive in car, Fire gave 2mg  intranasal and pt became responsive. Pt denies hx of drug use and pt unable to recall what drugs he used.   Pt is A&Ox4  EMS VS 128/84 79 HR 94 % RA  384 CBG

## 2021-09-18 NOTE — ED Provider Notes (Signed)
5:09 PM Assumed care for off going team.   Blood pressure 125/79, pulse 90, temperature 97.8 F (36.6 C), temperature source Oral, resp. rate 17, height 6\' 1"  (1.854 m), weight 83.9 kg, SpO2 95 %.  See their HPI for full report but in brief pending 2 hours obs   5:09 PM patient is requesting discharge home.  Patient does not need any additional Narcan.  His creatinine is up from 1.0-1.78.  Offered patient IV fluids but patient declined stating that he was ready to go.  He denies any muscle cramping or dark urine to suggest rhabdo.  We discussed the importance of increasing p.o. hydration and following up for recheck of his creatinine in a few days.  He expressed understanding but at this time wanted to be discharged home.  We also discussed his incidentally noted elevated glucose of 265.  Not sure if patient was given anything in the field to cause this so encouraged him to follow-up as well.  His white count was normal at 15.8 but he denies any other infectious symptoms.  He reports feeling at his baseline self at this time is ambulatory at bedside has not needed any additional Narcan.  He denies any SI feels comfortable with discharge  I discussed the provisional nature of ED diagnosis, the treatment so far, the ongoing plan of care, follow up appointments and return precautions with the patient and any family or support people present. They expressed understanding and agreed with the plan, discharged home.      11-01-2002, MD 09/18/21 [phone]

## 2021-09-18 NOTE — ED Provider Notes (Signed)
[facility] Provider Note    Event Date/Time   First MD Initiated Contact with Patient 09/18/21 1419     (approximate)   History   Drug Overdose   HPI  Amdrew Hunt is a 46 y.o. male  who per outside hospital paperwork dated 08/13/2021 was seen for opioid overdose, who presents to the emergency department today because of concerns for possible opioid overdose.  Patient was found unresponsive in a car.  When first responders got there they administered IM Narcan and the patient woke up.  He denies any opioid use here in the emergency department.  He states he does not remember what happens.     Physical Exam   Triage Vital Signs: ED Triage Vitals  Enc Vitals Group     BP 09/18/21 1422 (!) 157/102     Pulse Rate 09/18/21 1422 (!) 101     Resp 09/18/21 1422 (!) 22     Temp --      Temp src --      SpO2 09/18/21 1422 92 %     Weight 09/18/21 1425 185 lb (83.9 kg)     Height 09/18/21 1425 6\' 1"  (1.854 m)     Head Circumference --      Peak Flow --      Pain Score 09/18/21 1425 0     Pain Loc --      Pain Edu? --      Excl. in GC? --     Most recent vital signs: Vitals:   09/18/21 1422  BP: (!) 157/102  Pulse: (!) 101  Resp: (!) 22  SpO2: 92%   General: Awake, alert. CV:  Good peripheral perfusion. Regular rate and rhythm. Resp:  Normal effort. Lungs clear. Abd:  No distention. Non tender.    ED Results / Procedures / Treatments   Labs (all labs ordered are listed, but only abnormal results are displayed) Labs Reviewed - No data to display   EKG  I, 09/20/21, attending physician, personally viewed and interpreted this EKG  EKG Time: 1428 Rate: 99 Rhythm: sinus rhythm Axis: normal Intervals: qtc 485 QRS: narrow ST changes: no st elevation Impression: abnormal ekg   RADIOLOGY None   PROCEDURES:  Critical Care performed: No  Procedures   MEDICATIONS ORDERED IN ED: Medications - No data to  display   IMPRESSION / MDM / ASSESSMENT AND PLAN / ED COURSE  I reviewed the triage vital signs and the nursing notes.                              Differential diagnosis includes, but is not limited to, opioid overdose, arrhythmia, anemia.  Patient's presentation is most consistent with acute presentation with potential threat to life or bodily function.  Patient presented to the emergency department today after being found unresponsive and given IM Narcan in the field.  On my exam patient is awake.  Per chart review he has been seen for opioid overdose in the past.  We will plan on observing here in the emergency department.  Will check basic blood work. Patient denies any thoughts of self harm.   FINAL CLINICAL IMPRESSION(S) / ED DIAGNOSES   Final diagnoses:  Opiate overdose, undetermined intent, initial encounter [facility])     Note:  This document was prepared using Dragon voice recognition software and may include unintentional dictation errors.    [facility], MD 09/18/21 [phone]

## 2021-09-18 NOTE — Discharge Instructions (Addendum)
Your labs suggest some dehydration.  We have offered you IV fluids but he would like to go home and do p.o. hydration.  You can drink Gatorade without sugar, Pedialyte to help with the normal electrolytes.  If you develop muscle cramping, darkened urine or any other concerns please return to the ER immediately for recheck.  Otherwise I would like you to follow-up with your primary care doctor in the next 2 days for repeat evaluation.  Your sugar was also noted to be slightly elevated and needs to be rechecked and worked up to make sure he did not have diabetes.  Avoid drugs-you can get Narcan over-the-counter to prevent death

## 2021-11-08 ENCOUNTER — Emergency Department: Payer: Self-pay

## 2021-11-08 ENCOUNTER — Emergency Department
Admission: EM | Admit: 2021-11-08 | Discharge: 2021-11-08 | Disposition: A | Discharge status: Home / Self Care | Payer: Self-pay | Attending: Emergency Medicine | Admitting: Emergency Medicine

## 2021-11-08 ENCOUNTER — Other Ambulatory Visit: Payer: Self-pay

## 2021-11-08 DIAGNOSIS — R799 Abnormal finding of blood chemistry, unspecified: Secondary | ICD-10-CM | POA: Insufficient documentation

## 2021-11-08 DIAGNOSIS — T402X1A Poisoning by other opioids, accidental (unintentional), initial encounter: Secondary | ICD-10-CM | POA: Insufficient documentation

## 2021-11-08 DIAGNOSIS — X58XXXA Exposure to other specified factors, initial encounter: Secondary | ICD-10-CM | POA: Insufficient documentation

## 2021-11-08 DIAGNOSIS — T40601A Poisoning by unspecified narcotics, accidental (unintentional), initial encounter: Secondary | ICD-10-CM

## 2021-11-08 LAB — SALICYLATE LEVEL: Salicylate Lvl: <7 mg/dL — ABNORMAL LOW (ref 7.0–30.0)

## 2021-11-08 LAB — URINE DRUG SCREEN, QUALITATIVE (ARMC ONLY)
Amphetamines, Ur Screen: NOT DETECTED
Barbiturates, Ur Screen: NOT DETECTED
Benzodiazepine, Ur Scrn: NOT DETECTED
Cannabinoid 50 Ng, Ur ~~LOC~~: POSITIVE — AB
Cocaine Metabolite,Ur ~~LOC~~: POSITIVE — AB
MDMA (Ecstasy)Ur Screen: NOT DETECTED
Methadone Scn, Ur: NOT DETECTED
Opiate, Ur Screen: NOT DETECTED
Phencyclidine (PCP) Ur S: NOT DETECTED
Tricyclic, Ur Screen: NOT DETECTED

## 2021-11-08 LAB — CBC WITH DIFFERENTIAL/PLATELET
Abs Immature Granulocytes: 0.67 10*3/uL — ABNORMAL HIGH (ref 0.00–0.07)
Basophils Absolute: 0.1 10*3/uL (ref 0.0–0.1)
Basophils Relative: 1 %
Eosinophils Absolute: 0.3 10*3/uL (ref 0.0–0.5)
Eosinophils Relative: 2 %
HCT: 44 % (ref 39.0–52.0)
Hemoglobin: 13.3 g/dL (ref 13.0–17.0)
Immature Granulocytes: 5 %
Lymphocytes Relative: 32 %
Lymphs Abs: 4.6 10*3/uL — ABNORMAL HIGH (ref 0.7–4.0)
MCH: 28.4 pg (ref 26.0–34.0)
MCHC: 30.2 g/dL (ref 30.0–36.0)
MCV: 93.8 fL (ref 80.0–100.0)
Monocytes Absolute: 0.8 10*3/uL (ref 0.1–1.0)
Monocytes Relative: 6 %
Neutro Abs: 7.9 10*3/uL — ABNORMAL HIGH (ref 1.7–7.7)
Neutrophils Relative %: 54 %
Platelets: 207 10*3/uL (ref 150–400)
RBC: 4.69 MIL/uL (ref 4.22–5.81)
RDW: 14.4 % (ref 11.5–15.5)
WBC: 14.3 10*3/uL — ABNORMAL HIGH (ref 4.0–10.5)
nRBC: 0 % (ref 0.0–0.2)

## 2021-11-08 LAB — HEMOGLOBIN A1C
Hgb A1c MFr Bld: 5.5 % (ref 4.8–5.6)
Mean Plasma Glucose: 111.15 mg/dL

## 2021-11-08 LAB — COMPREHENSIVE METABOLIC PANEL
ALT: 34 U/L (ref 0–44)
AST: 27 U/L (ref 15–41)
Albumin: 4.1 g/dL (ref 3.5–5.0)
Alkaline Phosphatase: 54 U/L (ref 38–126)
Anion gap: 10 (ref 5–15)
BUN: 14 mg/dL (ref 6–20)
CO2: 25 mmol/L (ref 22–32)
Calcium: 8.6 mg/dL — ABNORMAL LOW (ref 8.9–10.3)
Chloride: 105 mmol/L (ref 98–111)
Creatinine, Ser: 1.57 mg/dL — ABNORMAL HIGH (ref 0.61–1.24)
GFR, Estimated: 56 mL/min — ABNORMAL LOW (ref >60)
Glucose, Bld: 394 mg/dL — ABNORMAL HIGH (ref 70–99)
Potassium: 3.3 mmol/L — ABNORMAL LOW (ref 3.5–5.1)
Sodium: 140 mmol/L (ref 135–145)
Total Bilirubin: 0.4 mg/dL (ref 0.3–1.2)
Total Protein: 7.3 g/dL (ref 6.5–8.1)

## 2021-11-08 LAB — ETHANOL: Alcohol, Ethyl (B): <10 mg/dL (ref <10)

## 2021-11-08 LAB — LIPASE, BLOOD: Lipase: 61 U/L — ABNORMAL HIGH (ref 11–51)

## 2021-11-08 LAB — CBG MONITORING, ED
Glucose-Capillary: 334 mg/dL — ABNORMAL HIGH (ref 70–99)
Glucose-Capillary: 77 mg/dL (ref 70–99)

## 2021-11-08 LAB — ACETAMINOPHEN LEVEL: Acetaminophen (Tylenol), Serum: <10 ug/mL — ABNORMAL LOW (ref 10–30)

## 2021-11-08 MED ORDER — DROPERIDOL 2.5 MG/ML IJ SOLN: 2.5000 mg | Freq: Once | INTRAMUSCULAR | Status: DC | PRN

## 2021-11-08 MED ORDER — DROPERIDOL 2.5 MG/ML IJ SOLN: 2.5000 mg | Freq: Once | INTRAMUSCULAR | Status: DC

## 2021-11-08 MED ORDER — LACTATED RINGERS IV BOLUS
1000.0000 mL | Freq: Once | INTRAVENOUS | Status: AC
  Administered 2021-11-08: 1000 mL via INTRAVENOUS

## 2021-11-08 MED ORDER — NALOXONE HCL 4 MG/0.1ML NA LIQD: NASAL | 1 refills | Status: AC

## 2021-11-08 NOTE — ED Notes (Signed)
pt in room 1 at 0340.  pupils pinpoint and sluggish 18g in right ac ZOL pads placed on pt at this at time cbg 342 narcan 2mg  administered at 0342 pt responding to narcan at 0344 pt awake and disoriented

## 2021-11-08 NOTE — Inpatient Diabetes Management (Signed)
Inpatient Diabetes Program Recommendations  AACE/ADA: New Consensus Statement on Inpatient Glycemic Control   Target Ranges:  Prepandial:   less than 140 mg/dL      Peak postprandial:   less than 180 mg/dL (1-2 hours)      Critically ill patients:  140 - 180 mg/dL    Latest Reference Range & Units 08/13/21 15:37 09/18/21 14:35 11/08/21 03:42  Glucose 70 - 99 mg/dL 650 (H) 354 (H) 656 (H)   Review of Glycemic Control  Diabetes history: No Outpatient Diabetes medications: NA Current orders for Inpatient glycemic control: None  Inpatient Diabetes Program Recommendations:    May want to consider checking CBGs and ordering an A1C.  NOTE: Patient brought in to ED via private vehicle from work and noted to be unresponsive and apneic with concerns of overdose. Per Dr. York Cerise note today, "ED staff found him barely breathing and completely unresponsive.  He was pulled from the vehicle and brought immediately to a room where I saw him upon arrival.  He was nearly apneic, diaphoretic, with pinpoint pupils and no response to painful stimuli. Ordered Narcan 2 mg IV which we administered as soon as possible once IV access was established.  Within less than 2 minutes, the patient started to move voluntarily and then sat straight up in bed".  Lab glucose 394 mg/dl on 11/06/25 at 5:17 am. Noted patient in ED on 09/18/21 with opoid overdose and lab glucose was 265 mg/dl on 0/01/74 at 94:49. Per chart, patient has no insurance or PCP. Sent chat message to DR. Funke to request checking CBGs and A1C.   Thanks, Orlando Penner, RN, MSN, CDCES Diabetes Coordinator Inpatient Diabetes Program [phone] (Team Pager from 8am to 5pm)

## 2021-11-08 NOTE — ED Notes (Signed)
Pts belongings include:  1 black boots (other shoe in the patients family vehicle) 1 pair blue jeans 1 black belt 1 white t shirt 1 gray tank 1 black t shirt 1 black hair wrap 1 - $100 bill

## 2021-11-08 NOTE — Discharge Instructions (Signed)
You have been seen in the Emergency Department (ED) today for opioid addiction and overdose.  You nearly died tonight because of the combination or quantity of drugs you took.   [facility] does not admit for opiate addiction, you are medically cleared to pursue outpatient treatment options.  You have been provided with some resources that may help, and if you have any outpatient physicians are therapist, they may also help provide additional resources.  Please take any prescriptions that have been provided as indicated on the instructions.  Please return to the ED immediately if you have ANY thoughts of hurting yourself or anyone else, so that we may help you.  Follow up with your doctor and/or therapist as soon as possible regarding today's ED visit.   Please follow up any other recommendations and clinic appointments provided by the psychiatry team that saw you in the Emergency Department.

## 2021-11-08 NOTE — ED Triage Notes (Signed)
Pt arrived via lobby per work pt may have ingested a substance . Pt brought back to room one immediately due to unresponsiveness and apnea

## 2021-11-08 NOTE — ED Provider Notes (Addendum)
[facility] Provider Note    Event Date/Time   First MD Initiated Contact with Patient 11/08/21 [phone]     (approximate)   History   Drug Overdose   HPI Level 5 caveat:  history/ROS limited by acute/critical illness  Tommy Hunt is a 46 y.o. male with known history of opioid abuse/addiction.  He presents by private vehicle completely unresponsive with concerns for overdose.  Reportedly he was at work and around 1:30 AM (approximately 2 hours prior to arrival to the ED) he started to act very sleepy and with a decreased level of responsiveness.  Reportedly his supervisor at work did not want to get him in trouble so he avoided calling anyone.  Eventually the patient's mother was called and she drove to his workplace to get him.  At that point he was minimally responsive and she had to have several people help load him into the car and she then drove to [facility].  ED staff found him barely breathing and completely unresponsive.  He was pulled from the vehicle and brought immediately to a room where I saw him upon arrival.  He was nearly apneic, diaphoretic, with pinpoint pupils and no response to painful stimuli.  See hospital course for details.       Physical Exam   Triage Vital Signs: ED Triage Vitals  Enc Vitals Group     BP 11/08/21 0348 113/78     Pulse Rate 11/08/21 0348 89     Resp 11/08/21 0348 20     Temp --      Temp Source 11/08/21 0348 Oral     SpO2 11/08/21 0344 99 %     Weight 11/08/21 0345 81.6 kg (180 lb)     Height 11/08/21 0345 1.854 m (6\' 1" )     Head Circumference --      Peak Flow --      Pain Score --      Pain Loc --      Pain Edu? --      Excl. in GC? --     Most recent vital signs: Vitals:   11/08/21 0630 11/08/21 0700  BP: 122/79 115/85  Pulse: 90 82  Resp: 12 16  Temp:    SpO2: 93% 92%     General:  Initially unresponsive with GCS of 3, awake and alert after Narcan. CV:  Good peripheral perfusion.  Normal heart  sounds. Resp:  Initially nearly apneic with only occasional shallow breath, breathing normally after Narcan.  Lungs are clear to auscultation. Abd:  No distention.  No tenderness to palpation. Other:  Patient initially diaphoretic with pinpoint pupils.  Symptoms resolved after IV Narcan administration.   ED Results / Procedures / Treatments   Labs (all labs ordered are listed, but only abnormal results are displayed) Labs Reviewed  COMPREHENSIVE METABOLIC PANEL - Abnormal; Notable for the following components:      Result Value   Potassium 3.3 (*)    Glucose, Bld 394 (*)    Creatinine, Ser 1.57 (*)    Calcium 8.6 (*)    GFR, Estimated 56 (*)    All other components within normal limits  CBC WITH DIFFERENTIAL/PLATELET - Abnormal; Notable for the following components:   WBC 14.3 (*)    Neutro Abs 7.9 (*)    Lymphs Abs 4.6 (*)    Abs Immature Granulocytes 0.67 (*)    All other components within normal limits  LIPASE, BLOOD - Abnormal; Notable for the  following components:   Lipase 61 (*)    All other components within normal limits  SALICYLATE LEVEL - Abnormal; Notable for the following components:   Salicylate Lvl <7.0 (*)    All other components within normal limits  ACETAMINOPHEN LEVEL - Abnormal; Notable for the following components:   Acetaminophen (Tylenol), Serum <10 (*)    All other components within normal limits  ETHANOL  URINE DRUG SCREEN, QUALITATIVE ([facility] ONLY)     EKG  ED ECG REPORT I, Loleta Rose, the attending physician, personally viewed and interpreted this ECG.  Date: 11/08/2021 EKG Time: 3:51 AM Rate: 102 Rhythm: Sinus tachycardia QRS Axis: normal Intervals: normal Narrative interpretation/ST/T Wave abnormalities: Patient is shivering and the quality of the EKG is poor with a great deal of artifact.  Difficult to interpret.  Patient not reporting chest pain.    PROCEDURES:  Critical Care performed: Yes, see critical care procedure  note(s)  .1-3 Lead EKG Interpretation  Performed by: Loleta Rose, MD Authorized by: Loleta Rose, MD     Interpretation: normal     ECG rate:  88   ECG rate assessment: normal     Rhythm: sinus rhythm     Ectopy: none     Conduction: normal   .Critical Care  Performed by: Loleta Rose, MD Authorized by: Loleta Rose, MD   Critical care provider statement:    Critical care time (minutes):  30   Critical care time was exclusive of:  Separately billable procedures and treating other patients   Critical care was necessary to treat or prevent imminent or life-threatening deterioration of the following conditions:  Toxidrome   Critical care was time spent personally by me on the following activities:  Development of treatment plan with patient or surrogate, evaluation of patient's response to treatment, examination of patient, obtaining history from patient or surrogate, ordering and performing treatments and interventions, ordering and review of laboratory studies, ordering and review of radiographic studies, pulse oximetry, re-evaluation of patient's condition and review of old charts    MEDICATIONS ORDERED IN ED: Medications  droperidol (INAPSINE) 2.5 MG/ML injection 2.5 mg (has no administration in time range)  lactated ringers bolus 1,000 mL (0 mLs Intravenous Stopped 11/08/21 0526)     IMPRESSION / MDM / ASSESSMENT AND PLAN / ED COURSE  I reviewed the triage vital signs and the nursing notes.                              Differential diagnosis includes, but is not limited to, opioid overdose, other nonspecific drug abuse or overdose, electrolyte or metabolic abnormality, trauma with possible intracranial bleed.  Patient's presentation is most consistent with acute presentation with potential threat to life or bodily function.  Patient is unresponsive with occasional voluntary but very shallow breaths, no response to pain, and pinpoint pupils.  No obvious signs of  trauma.  Fingerstick blood sugar was 342.  Initial SPO2 was approximately 30% with a good waveform and we assisted respirations with bag-valve-mask.  I ordered Narcan 2 mg IV which we administered as soon as possible once IV access was established.  Within less than 2 minutes, the patient started to move voluntarily and then sat straight up in bed.  He was combative for a few minutes but then settled down and became conversant, answering questions, and acting more appropriate.  He denies any pain, says he is just cold.  Denies intentional overdose.  Admits  he took something but will not state what he took.  Hospital security searched his clothing and found 2 small round scored light blue tablets with an "M" imprinted on one side and the number "30" on the other.  Using the [employer] Pill ID app, I identified these as most likely being oxycodone 30 mg tablets.  Given that this is not an illegal substance, but they represent an acute and immediate life threat to the patient, as well as possibly being contaminated with other substances, the security staff and I placed the tablets in the medication waste container of the ED. the rest of his belongings were catalogued as per protocol by security staff.  The patient is now awake and alert and gave me permission to talk with his mother who brought him.  I updated his mother, girlfriend and cousin who are all in the family room.  They are currently declining to come to the patient's room but will wait while he is observed.  Labs ordered include acetaminophen level, salicylate level, lipase, CMP, CBC with differential, ethanol, and urine drug screen.  The patient is on the cardiac monitor to evaluate for evidence of arrhythmia and/or significant heart rate changes.  Clinical Course as of 11/08/21 0729  Thu Nov 08, 2021  0547 Patient is sleeping, cardiac monitoring reassuring, 97% SPO2 [CF]  0630 Patient still sleeping but has been stable. [CF]  [id number]  Transferring ED care to Dr. Jari Pigg to reassess once the patient is awake and sober. [CF]    Clinical Course User Index [CF] Hinda Kehr, MD     FINAL CLINICAL IMPRESSION(S) / ED DIAGNOSES   Final diagnoses:  Opiate overdose, accidental or unintentional, initial encounter [facility])     Rx / DC Orders   ED Discharge Orders          Ordered    naloxone (NARCAN) nasal spray 4 mg/0.1 mL        11/08/21 0729             Note:  This document was prepared using Dragon voice recognition software and may include unintentional dictation errors.   Hinda Kehr, MD 11/08/21 OA:[id number]    Hinda Kehr, MD 11/08/21 [phone]

## 2021-11-08 NOTE — ED Provider Notes (Signed)
8:44 AM Assumed care for off going team.   Blood pressure 113/80, pulse 71, temperature (!) 97.5 F (36.4 C), temperature source Oral, resp. rate 11, height 6\' 1"  (1.854 m), weight 81.6 kg, SpO2 97 %.  See their HPI for full report but in brief pending sober re-eval   Patient slightly desatted while asleep.  Patient was placed on 2 L.  Will get chest x-ray to make sure no evidence of aspiration.  Patient has been up and ambulatory going to the bathroom prior to this so I suspect that this is more likely just related to the substances but I want to make sure that he still having good adequate end-tidal and does not need additional Narcan  8:47 AM reevaluated patient end-tidal is reassuring.  Patient wakes up to voice.  Patient updated on getting a chest x-ray.   12:30 PM  Chest x-ray was personally reviewed and interpreted no evidence of aspiration pneumonia.  Patient's been off oxygen with normal sats walking around the room.  Patient is requesting discharge home and has a ride home.  He denies any SI.       , MD 11/08/21 1254

## 2023-01-31 IMAGING — DX DG CHEST 2V
2 series · 2 of 2 positions shown · non-contrast
Comparison: November 27, 2020.

CLINICAL DATA: Shortness of breath.

EXAM:
CHEST - 2 VIEW

[chest pa]
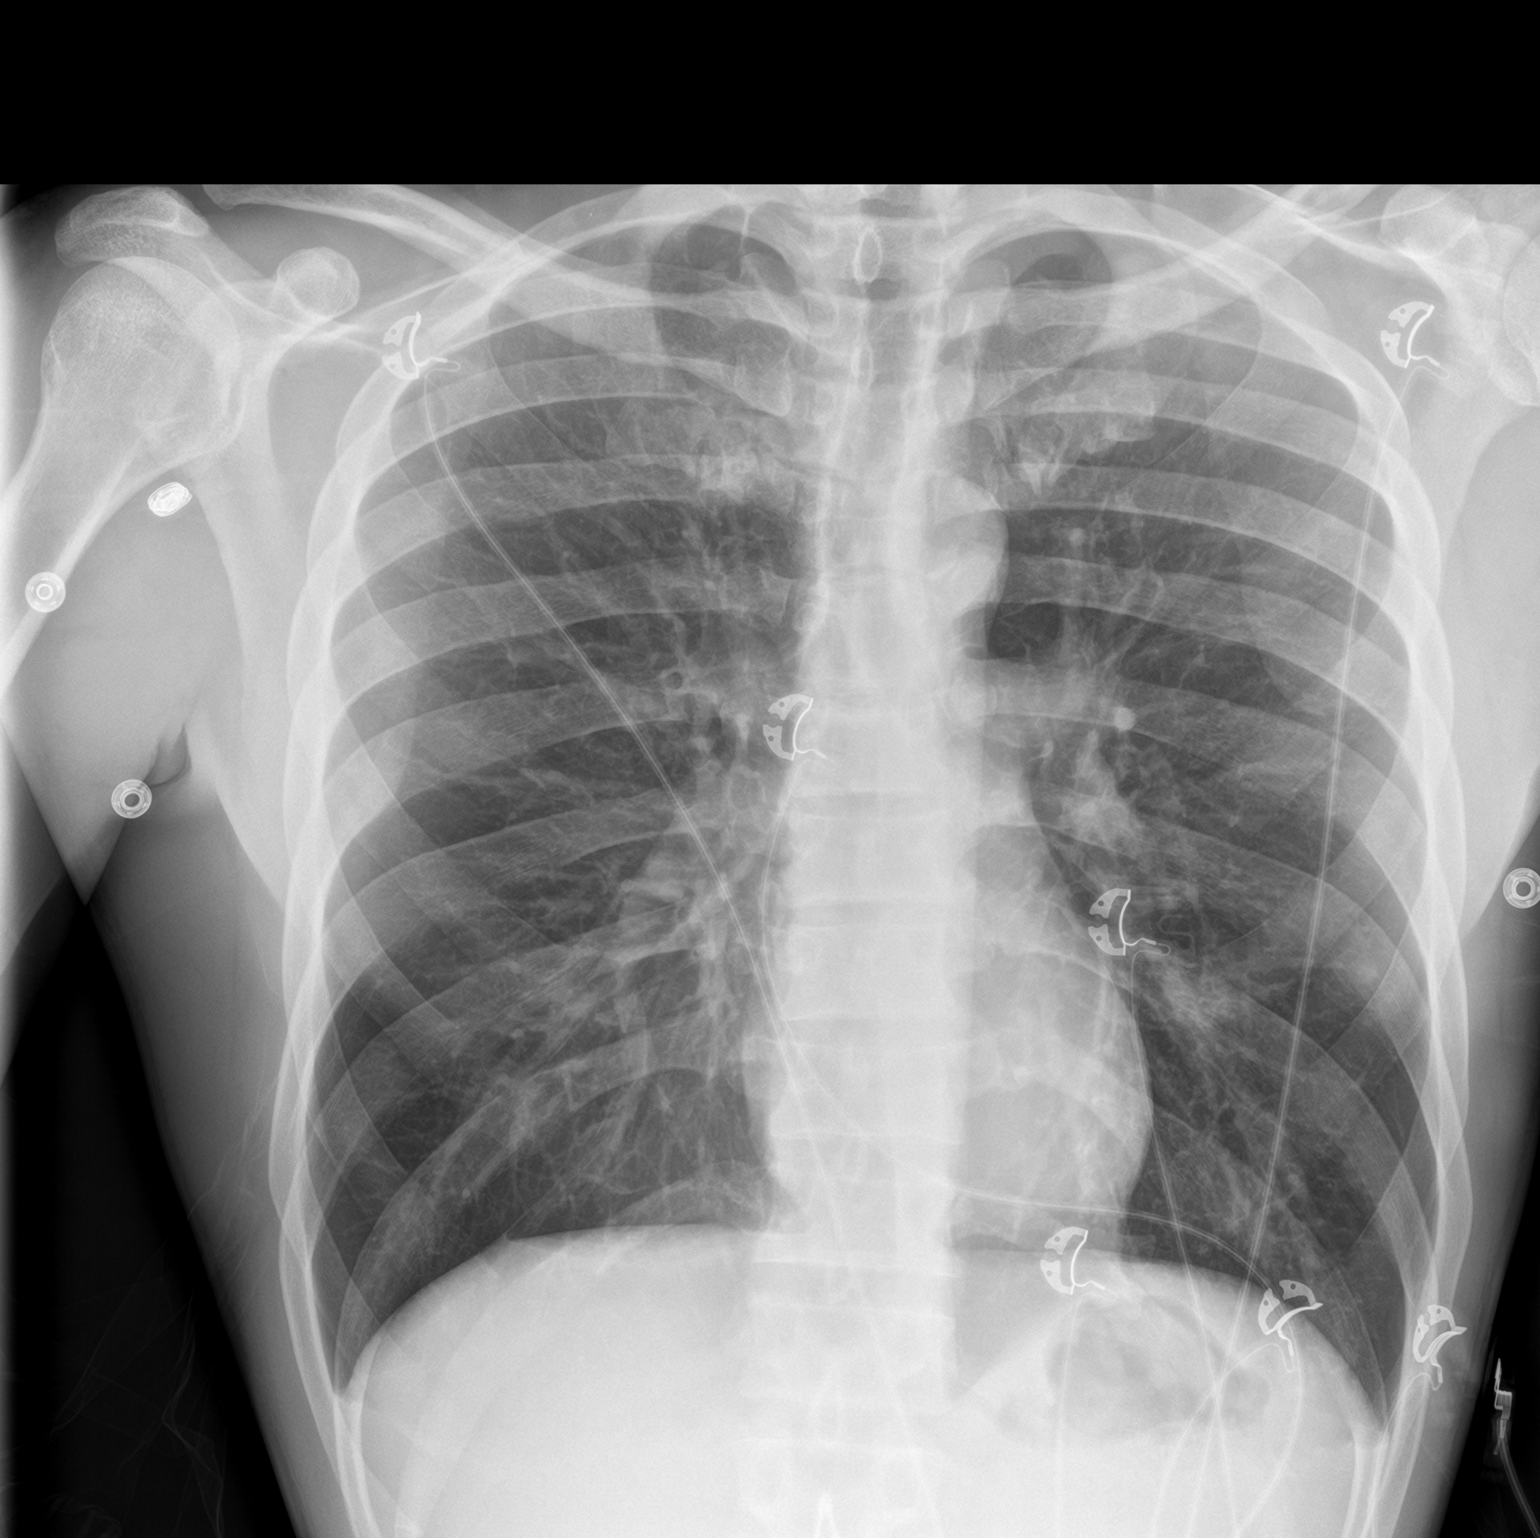

[chest lat]
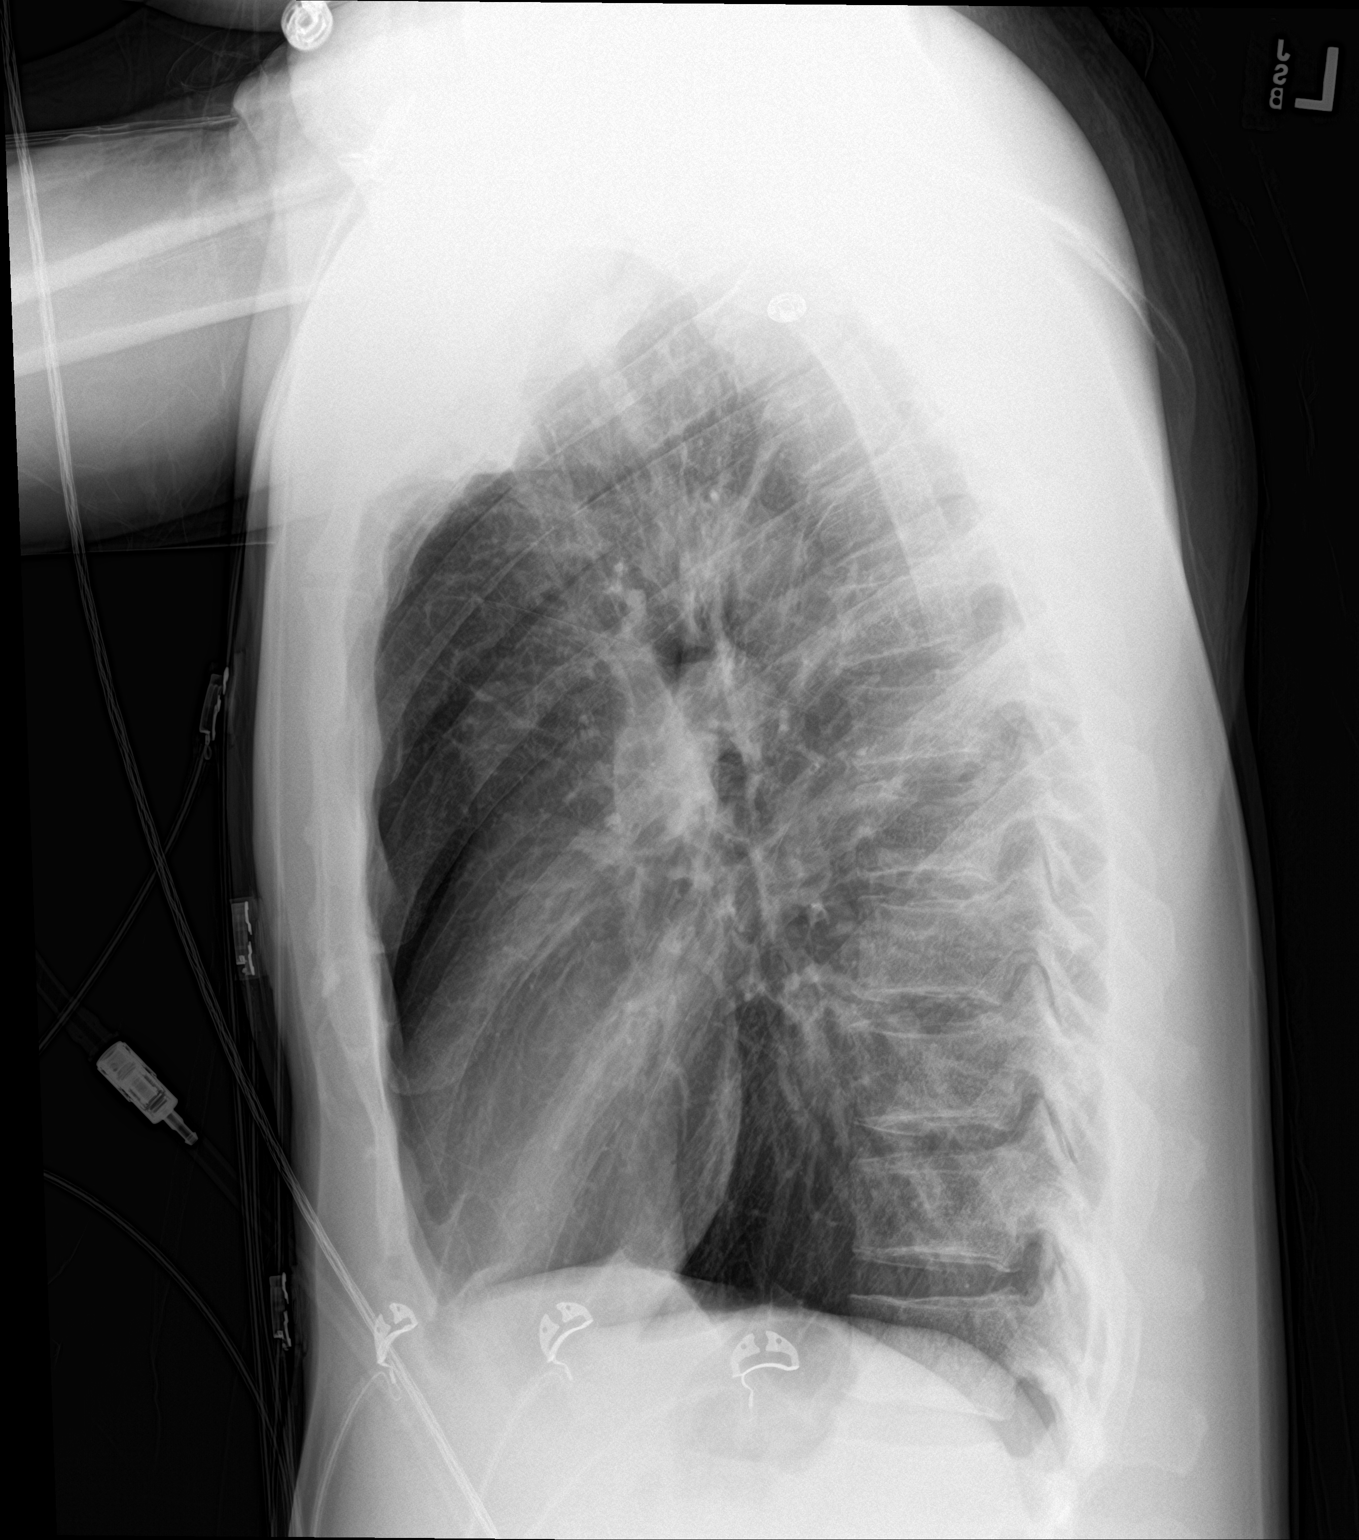

[2 of 2 positions shown; findings below may reference images not displayed]

FINDINGS: The heart size and mediastinal contours are within normal limits.
Both lungs are clear. The visualized skeletal structures are
unremarkable.
IMPRESSION: No active cardiopulmonary disease.

## 2023-02-23 ENCOUNTER — Other Ambulatory Visit: Payer: Self-pay

## 2023-02-23 ENCOUNTER — Emergency Department: Payer: Self-pay

## 2023-02-23 ENCOUNTER — Inpatient Hospital Stay
Admission: EM | Admit: 2023-02-23 | Discharge: 2023-02-24 | DRG: 153 | Disposition: A | Discharge status: Home / Self Care | Payer: Self-pay | Attending: Internal Medicine | Admitting: Internal Medicine

## 2023-02-23 DIAGNOSIS — J039 Acute tonsillitis, unspecified: Principal | ICD-10-CM | POA: Diagnosis present

## 2023-02-23 DIAGNOSIS — F1910 Other psychoactive substance abuse, uncomplicated: Secondary | ICD-10-CM | POA: Diagnosis present

## 2023-02-23 DIAGNOSIS — F17200 Nicotine dependence, unspecified, uncomplicated: Secondary | ICD-10-CM | POA: Diagnosis present

## 2023-02-23 DIAGNOSIS — Z1152 Encounter for screening for COVID-19: Secondary | ICD-10-CM

## 2023-02-23 DIAGNOSIS — IMO0001 Reserved for inherently not codable concepts without codable children: Secondary | ICD-10-CM | POA: Diagnosis present

## 2023-02-23 DIAGNOSIS — F1210 Cannabis abuse, uncomplicated: Secondary | ICD-10-CM | POA: Diagnosis present

## 2023-02-23 DIAGNOSIS — F1110 Opioid abuse, uncomplicated: Secondary | ICD-10-CM | POA: Diagnosis present

## 2023-02-23 DIAGNOSIS — F1410 Cocaine abuse, uncomplicated: Secondary | ICD-10-CM | POA: Diagnosis present

## 2023-02-23 DIAGNOSIS — F17210 Nicotine dependence, cigarettes, uncomplicated: Secondary | ICD-10-CM | POA: Diagnosis present

## 2023-02-23 DIAGNOSIS — K0889 Other specified disorders of teeth and supporting structures: Secondary | ICD-10-CM | POA: Diagnosis present

## 2023-02-23 DIAGNOSIS — R490 Dysphonia: Secondary | ICD-10-CM | POA: Diagnosis present

## 2023-02-23 DIAGNOSIS — E876 Hypokalemia: Secondary | ICD-10-CM | POA: Diagnosis present

## 2023-02-23 DIAGNOSIS — J439 Emphysema, unspecified: Secondary | ICD-10-CM | POA: Diagnosis present

## 2023-02-23 DIAGNOSIS — J020 Streptococcal pharyngitis: Principal | ICD-10-CM | POA: Diagnosis present

## 2023-02-23 HISTORY — DX: Unilateral inguinal hernia, without obstruction or gangrene, not specified as recurrent: K40.90

## 2023-02-23 HISTORY — DX: Nicotine dependence, unspecified, uncomplicated: F17.200

## 2023-02-23 HISTORY — DX: Reserved for inherently not codable concepts without codable children: IMO0001

## 2023-02-23 HISTORY — DX: Other psychoactive substance abuse, uncomplicated: F19.10

## 2023-02-23 LAB — CBC WITH DIFFERENTIAL/PLATELET
Abs Immature Granulocytes: 0.06 10*3/uL (ref 0.00–0.07)
Basophils Absolute: 0 10*3/uL (ref 0.0–0.1)
Basophils Relative: 0 %
Eosinophils Absolute: 0.1 10*3/uL (ref 0.0–0.5)
Eosinophils Relative: 0 %
HCT: 41.2 % (ref 39.0–52.0)
Hemoglobin: 13.8 g/dL (ref 13.0–17.0)
Immature Granulocytes: 1 %
Lymphocytes Relative: 10 %
Lymphs Abs: 1.3 10*3/uL (ref 0.7–4.0)
MCH: 29.5 pg (ref 26.0–34.0)
MCHC: 33.5 g/dL (ref 30.0–36.0)
MCV: 88 fL (ref 80.0–100.0)
Monocytes Absolute: 1.1 10*3/uL — ABNORMAL HIGH (ref 0.1–1.0)
Monocytes Relative: 8 %
Neutro Abs: 10.2 10*3/uL — ABNORMAL HIGH (ref 1.7–7.7)
Neutrophils Relative %: 81 %
Platelets: 171 10*3/uL (ref 150–400)
RBC: 4.68 MIL/uL (ref 4.22–5.81)
RDW: 13.3 % (ref 11.5–15.5)
WBC: 12.7 10*3/uL — ABNORMAL HIGH (ref 4.0–10.5)
nRBC: 0 % (ref 0.0–0.2)

## 2023-02-23 LAB — BASIC METABOLIC PANEL
Anion gap: 9 (ref 5–15)
BUN: 15 mg/dL (ref 6–20)
CO2: 25 mmol/L (ref 22–32)
Calcium: 8.6 mg/dL — ABNORMAL LOW (ref 8.9–10.3)
Chloride: 102 mmol/L (ref 98–111)
Creatinine, Ser: 0.91 mg/dL (ref 0.61–1.24)
GFR, Estimated: >60 mL/min (ref >60)
Glucose, Bld: 157 mg/dL — ABNORMAL HIGH (ref 70–99)
Potassium: 3.3 mmol/L — ABNORMAL LOW (ref 3.5–5.1)
Sodium: 136 mmol/L (ref 135–145)

## 2023-02-23 LAB — RESP PANEL BY RT-PCR (RSV, FLU A&B, COVID)  RVPGX2
Influenza A by PCR: NEGATIVE
Influenza B by PCR: NEGATIVE
Resp Syncytial Virus by PCR: NEGATIVE
SARS Coronavirus 2 by RT PCR: NEGATIVE

## 2023-02-23 LAB — GROUP A STREP BY PCR: Group A Strep by PCR: DETECTED — AB

## 2023-02-23 MED ORDER — OXYMETAZOLINE HCL 0.05 % NA SOLN
1.0000 | Freq: Once | NASAL | Status: AC
  Administered 2023-02-23: 1 via NASAL
  Filled 2023-02-23: qty 30

## 2023-02-23 MED ORDER — KETOROLAC TROMETHAMINE 15 MG/ML IJ SOLN
15.0000 mg | Freq: Once | INTRAMUSCULAR | Status: AC
  Administered 2023-02-23: 15 mg via INTRAVENOUS
  Filled 2023-02-23: qty 1

## 2023-02-23 MED ORDER — LIDOCAINE HCL URETHRAL/MUCOSAL 2 % EX GEL
1.0000 | Freq: Once | CUTANEOUS | Status: AC
  Administered 2023-02-23: 1
  Filled 2023-02-23: qty 6

## 2023-02-23 MED ORDER — DEXAMETHASONE SODIUM PHOSPHATE 10 MG/ML IJ SOLN
10.0000 mg | Freq: Once | INTRAMUSCULAR | Status: AC
  Administered 2023-02-23: 10 mg via INTRAVENOUS
  Filled 2023-02-23: qty 1

## 2023-02-23 MED ORDER — AMOXICILLIN-POT CLAVULANATE 875-125 MG PO TABS: 1.0000 | ORAL_TABLET | Freq: Two times a day (BID) | ORAL | 0 refills | Status: DC

## 2023-02-23 MED ORDER — IOHEXOL 300 MG/ML  SOLN
75.0000 mL | Freq: Once | INTRAMUSCULAR | Status: AC | PRN
  Administered 2023-02-23: 75 mL via INTRAVENOUS

## 2023-02-23 MED ORDER — SODIUM CHLORIDE 0.9 % IV SOLN
3.0000 g | Freq: Once | INTRAVENOUS | Status: AC
  Administered 2023-02-23: 3 g via INTRAVENOUS
  Filled 2023-02-23: qty 8

## 2023-02-23 NOTE — ED Provider Notes (Signed)
-----------------------------------------   10:03 PM on 02/23/2023 -----------------------------------------  Blood pressure 116/70, pulse 86, temperature 98.1 F (36.7 C), temperature source Oral, resp. rate 17, height 6' (1.829 m), weight 81.6 kg, SpO2 96%.  Assuming care from Cruz Condon, PA-C.  In short, Tommy Hunt is a 47 y.o. male with a chief complaint of Sore Throat .  Refer to the original H&P for additional details.  The current plan of care is to await CT results.  Patient presents to the ED with sore throat, muffled voice, trismus.  The patient had labs which revealed strep, elevated white blood cell count.  CT scan is pending.  I discussed the results of the CT with the radiologist.  Patient has a retropharyngeal fluid collection favored to be effusion versus abscess.  There is significant left-sided pharyngeal edema.  There is no discrete tonsillar fluid collection.  I discussed at length these results with the radiologist and she favors a significant pharyngitis/tonsillitis versus retropharyngeal abscess.  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  This does extend into the neck that there does not appear to be compression on the vasculature or thrombus consistent with Lemierre's.   I discussed the patient's presentation with our on-call ENT surgeon, Dr. Andee Poles.  Patient is to be evaluated at bedside with flexible scope to evaluate patient's airway and the CT findings.  Patient will be admitted to the hospitalist team for antibiotics and steroids.  ENT again will be evaluating airway and presence of this fluid collection in the retropharyngeal area.   Dr Dolores Frame to talk with Hospitalist for admission at shift change   ED diagnosis:  Strep pharyngitis Retropharyngeal fluid collection Pharyngitis    Racheal Patches, PA-C 02/24/23 0024    Irean Hong, MD 03/01/23 [phone]

## 2023-02-23 NOTE — ED Provider Notes (Signed)
[facility] Provider Note    Event Date/Time   First MD Initiated Contact with Patient 02/23/23 1913     (approximate)   History   Sore Throat   HPI  Tommy Hunt is a 47 y.o. male  with PMH of opioid abuse presents for evaluation of dental pain and sore throat.  Patient describes his pain as being at one of his molars on the left bottom jaw.  He notes that the pain has traveled down to his throat and neck.  He says it is swollen and very tender.      Physical Exam   Triage Vital Signs: ED Triage Vitals  Encounter Vitals Group     BP 02/23/23 1653 130/89     Systolic BP Percentile --      Diastolic BP Percentile --      Pulse Rate 02/23/23 1653 89     Resp 02/23/23 1653 16     Temp 02/23/23 1653 98 F (36.7 C)     Temp Source 02/23/23 1653 Oral     SpO2 02/23/23 1653 95 %     Weight 02/23/23 1652 180 lb (81.6 kg)     Height 02/23/23 1652 6' (1.829 m)     Head Circumference --      Peak Flow --      Pain Score 02/23/23 1651 8     Pain Loc --      Pain Education --      Exclude from Growth Chart --     Most recent vital signs: Vitals:   02/23/23 2008 02/23/23 2012  BP: 116/70 116/70  Pulse: 86 86  Resp:  17  Temp: 98.1 F (36.7 C) 98.1 F (36.7 C)  SpO2: 96% 96%    General: Awake, no distress.  CV:  Good peripheral perfusion.  Resp:  Normal effort.  Abd:  No distention.  Other:  Very difficult to do an oral exam as patient cannot open his mouth very wide, from what I can see he has pharyngeal erythema with left tonsillar swelling with possible exudates, left anterior cervical lymphadenopathy, voice is hoarse and patient is frequently spitting out secretions.    ED Results / Procedures / Treatments   Labs (all labs ordered are listed, but only abnormal results are displayed) Labs Reviewed  GROUP A STREP BY PCR - Abnormal; Notable for the following components:      Result Value   Group A Strep by PCR DETECTED (*)    All  other components within normal limits  BASIC METABOLIC PANEL - Abnormal; Notable for the following components:   Potassium 3.3 (*)    Glucose, Bld 157 (*)    Calcium 8.6 (*)    All other components within normal limits  CBC WITH DIFFERENTIAL/PLATELET - Abnormal; Notable for the following components:   WBC 12.7 (*)    Neutro Abs 10.2 (*)    Monocytes Absolute 1.1 (*)    All other components within normal limits  RESP PANEL BY RT-PCR (RSV, FLU A&B, COVID)  [id number]    RADIOLOGY  CT soft tissue obtained, results are pending.  PROCEDURES:  Critical Care performed: No  Procedures   MEDICATIONS ORDERED IN ED: Medications  dexamethasone (DECADRON) injection 10 mg (10 mg Intravenous Given 02/23/23 1947)  ketorolac (TORADOL) 15 MG/ML injection 15 mg (15 mg Intravenous Given 02/23/23 1947)  iohexol (OMNIPAQUE) 300 MG/ML solution 75 mL (75 mLs Intravenous Contrast Given 02/23/23 2047)  Ampicillin-Sulbactam (UNASYN) 3 g  in sodium chloride 0.9 % 100 mL IVPB (0 g Intravenous Stopped 02/23/23 2158)     IMPRESSION / MDM / ASSESSMENT AND PLAN / ED COURSE  I reviewed the triage vital signs and the nursing notes.                             47 year old male presents for evaluation of sore throat and dental pain.  Vital signs are stable, patient uncomfortable on exam.  Differential diagnosis includes, but is not limited to, dental abscess, tonsillitis, peritonsillar abscess, retropharyngeal abscess, pharyngitis.  Patient's presentation is most consistent with acute complicated illness / injury requiring diagnostic workup.  BMP notable for mild hypokalemia, CBC notable for mildly elevated WBCs.  Respiratory panel negative for flu, COVID and RSV.  Patient did test positive for strep.  CT soft tissue is pending but I do suspect an abscess is not send irregularity seen on the left side when compared to the right.  Patient was given dexamethasone, Toradol and Unasyn.  Care of the patient will be  passed off to the oncoming provider, who will make the final diagnosis and plan.   FINAL CLINICAL IMPRESSION(S) / ED DIAGNOSES   Final diagnoses:  Strep throat     Rx / DC Orders   ED Discharge Orders          Ordered    amoxicillin-clavulanate (AUGMENTIN) 875-125 MG tablet  2 times daily        02/23/23 2158             Note:  This document was prepared using Dragon voice recognition software and may include unintentional dictation errors.   Cameron Ali, PA-C 02/23/23 2205    Chesley Noon, MD 02/23/23 [phone]

## 2023-02-23 NOTE — ED Triage Notes (Signed)
Patient c/o tooth ache yesterday and pain has moved down from there on left side.

## 2023-02-23 NOTE — ED Notes (Addendum)
Pt to the desk multiple times asking to leave. PA made aware

## 2023-02-24 ENCOUNTER — Encounter: Payer: Self-pay | Admitting: Internal Medicine

## 2023-02-24 DIAGNOSIS — K4090 Unilateral inguinal hernia, without obstruction or gangrene, not specified as recurrent: Secondary | ICD-10-CM | POA: Insufficient documentation

## 2023-02-24 DIAGNOSIS — J020 Streptococcal pharyngitis: Secondary | ICD-10-CM | POA: Diagnosis present

## 2023-02-24 DIAGNOSIS — J039 Acute tonsillitis, unspecified: Secondary | ICD-10-CM | POA: Diagnosis present

## 2023-02-24 DIAGNOSIS — F17200 Nicotine dependence, unspecified, uncomplicated: Secondary | ICD-10-CM | POA: Diagnosis present

## 2023-02-24 DIAGNOSIS — E876 Hypokalemia: Secondary | ICD-10-CM | POA: Diagnosis present

## 2023-02-24 DIAGNOSIS — F1910 Other psychoactive substance abuse, uncomplicated: Secondary | ICD-10-CM | POA: Diagnosis present

## 2023-02-24 LAB — BASIC METABOLIC PANEL
Anion gap: 11 (ref 5–15)
BUN: 17 mg/dL (ref 6–20)
CO2: 25 mmol/L (ref 22–32)
Calcium: 8.9 mg/dL (ref 8.9–10.3)
Chloride: 104 mmol/L (ref 98–111)
Creatinine, Ser: 0.81 mg/dL (ref 0.61–1.24)
GFR, Estimated: >60 mL/min (ref >60)
Glucose, Bld: 153 mg/dL — ABNORMAL HIGH (ref 70–99)
Potassium: 4 mmol/L (ref 3.5–5.1)
Sodium: 140 mmol/L (ref 135–145)

## 2023-02-24 LAB — URINE DRUG SCREEN, QUALITATIVE (ARMC ONLY)
Amphetamines, Ur Screen: NOT DETECTED
Barbiturates, Ur Screen: NOT DETECTED
Benzodiazepine, Ur Scrn: NOT DETECTED
Cannabinoid 50 Ng, Ur ~~LOC~~: POSITIVE — AB
Cocaine Metabolite,Ur ~~LOC~~: POSITIVE — AB
MDMA (Ecstasy)Ur Screen: NOT DETECTED
Methadone Scn, Ur: NOT DETECTED
Opiate, Ur Screen: NOT DETECTED
Phencyclidine (PCP) Ur S: NOT DETECTED
Tricyclic, Ur Screen: NOT DETECTED

## 2023-02-24 LAB — APTT: aPTT: 34 s (ref 24–36)

## 2023-02-24 LAB — CBC
HCT: 42 % (ref 39.0–52.0)
Hemoglobin: 13.7 g/dL (ref 13.0–17.0)
MCH: 28.9 pg (ref 26.0–34.0)
MCHC: 32.6 g/dL (ref 30.0–36.0)
MCV: 88.6 fL (ref 80.0–100.0)
Platelets: 173 10*3/uL (ref 150–400)
RBC: 4.74 MIL/uL (ref 4.22–5.81)
RDW: 13.2 % (ref 11.5–15.5)
WBC: 12.2 10*3/uL — ABNORMAL HIGH (ref 4.0–10.5)
nRBC: 0 % (ref 0.0–0.2)

## 2023-02-24 LAB — MAGNESIUM: Magnesium: 2.1 mg/dL (ref 1.7–2.4)

## 2023-02-24 LAB — PROTIME-INR
INR: 1.3 — ABNORMAL HIGH (ref 0.8–1.2)
Prothrombin Time: 15.9 s — ABNORMAL HIGH (ref 11.4–15.2)

## 2023-02-24 LAB — HIV ANTIBODY (ROUTINE TESTING W REFLEX): HIV Screen 4th Generation wRfx: NONREACTIVE

## 2023-02-24 MED ORDER — DEXAMETHASONE SODIUM PHOSPHATE 10 MG/ML IJ SOLN
10.0000 mg | Freq: Three times a day (TID) | INTRAMUSCULAR | Status: DC
  Administered 2023-02-24: 10 mg via INTRAVENOUS
  Filled 2023-02-24 (×2): qty 1

## 2023-02-24 MED ORDER — SODIUM CHLORIDE 0.9 % IV BOLUS
1000.0000 mL | Freq: Once | INTRAVENOUS | Status: AC
  Administered 2023-02-24: 1000 mL via INTRAVENOUS

## 2023-02-24 MED ORDER — ALBUTEROL SULFATE (2.5 MG/3ML) 0.083% IN NEBU: 2.5000 mg | INHALATION_SOLUTION | RESPIRATORY_TRACT | Status: DC | PRN

## 2023-02-24 MED ORDER — SODIUM CHLORIDE 0.9 % IV SOLN: INTRAVENOUS | Status: DC

## 2023-02-24 MED ORDER — ALBUTEROL SULFATE HFA 108 (90 BASE) MCG/ACT IN AERS: 2.0000 | INHALATION_SPRAY | RESPIRATORY_TRACT | Status: DC | PRN

## 2023-02-24 MED ORDER — HEPARIN SODIUM (PORCINE) 5000 UNIT/ML IJ SOLN
5000.0000 [IU] | Freq: Three times a day (TID) | INTRAMUSCULAR | Status: DC
  Administered 2023-02-24: 5000 [IU] via SUBCUTANEOUS
  Filled 2023-02-24: qty 1

## 2023-02-24 MED ORDER — ACETAMINOPHEN 160 MG/5ML PO SOLN: 650.0000 mg | Freq: Four times a day (QID) | ORAL | Status: DC | PRN

## 2023-02-24 MED ORDER — POTASSIUM CHLORIDE 20 MEQ PO PACK
40.0000 meq | PACK | Freq: Once | ORAL | Status: AC
  Administered 2023-02-24: 40 meq via ORAL
  Filled 2023-02-24: qty 2

## 2023-02-24 MED ORDER — KETOROLAC TROMETHAMINE 30 MG/ML IJ SOLN
30.0000 mg | Freq: Four times a day (QID) | INTRAMUSCULAR | Status: DC | PRN
  Administered 2023-02-24: 30 mg via INTRAVENOUS
  Filled 2023-02-24: qty 1

## 2023-02-24 MED ORDER — AMOXICILLIN-POT CLAVULANATE 875-125 MG PO TABS: 1.0000 | ORAL_TABLET | Freq: Two times a day (BID) | ORAL | 0 refills | Status: AC

## 2023-02-24 MED ORDER — PREDNISONE 10 MG PO TABS: ORAL_TABLET | ORAL | 0 refills | Status: AC

## 2023-02-24 MED ORDER — SODIUM CHLORIDE 0.9 % IV SOLN
3.0000 g | Freq: Four times a day (QID) | INTRAVENOUS | Status: DC
  Administered 2023-02-24: 3 g via INTRAVENOUS
  Filled 2023-02-24 (×2): qty 8

## 2023-02-24 MED ORDER — SODIUM CHLORIDE 0.9 % IV SOLN: 3.0000 g | Freq: Four times a day (QID) | INTRAVENOUS | Status: DC

## 2023-02-24 MED ORDER — NICOTINE 21 MG/24HR TD PT24
21.0000 mg | MEDICATED_PATCH | Freq: Every day | TRANSDERMAL | Status: DC
  Filled 2023-02-24: qty 1

## 2023-02-24 MED ORDER — ONDANSETRON HCL 4 MG/2ML IJ SOLN: 4.0000 mg | Freq: Three times a day (TID) | INTRAMUSCULAR | Status: DC | PRN

## 2023-02-24 NOTE — Progress Notes (Signed)
Pharmacy Antibiotic Note  Tommy Hunt is a 47 y.o. male admitted on 02/23/2023 with tonsillitis, pharyngitis .  Pharmacy has been consulted for Unasyn dosing.  Plan: Unasyn 3 gm q6hr per indication & renal fxn.  Pharmacy will continue to follow and will adjust abx dosing whenever warranted.  Temp (24hrs), Avg:98 F (36.7 C), Min:97.7 F (36.5 C), Max:98.1 F (36.7 C)   Recent Labs  Lab 02/23/23 1950  WBC 12.7*  CREATININE 0.91    Estimated Creatinine Clearance: 112.5 mL/min (by C-G formula based on SCr of 0.91 mg/dL).    No Known Allergies  Antimicrobials this admission: 11/17 Unasyn >>   Microbiology results: 11/17 BCx: Sent 11/17 Group A Strep PCR: Detected No lab cx currently ordered or pending at this time.  Thank you for allowing pharmacy to be a part of this patient's care.  Otelia Sergeant, PharmD, [facility] 02/24/2023 12:56 AM

## 2023-02-24 NOTE — Consult Note (Signed)
Valery, Cirrito [id number] 07-May-1975 Lorretta Harp, MD  Reason for Consult: Sore throat, odynophagia  HPI: Patient is a 47 year old male with one week history of progressively worsening sore throat.  He reports it began with a tooth ache in right mandibular region.  The pain has persisted and he began having issues with swallowing and presented to ER.  He currently reports significant improvement in his symptoms since having IV Decadron.  Strep in triage was positive.  He denies breathing difficulty.  He previously had reported neck pain but reports that has resolved.  He continues to have voice changes.  He reports he is hungry and wants something to eat.  He denies previous similar episodes.  Allergies: No Known Allergies  ROS: Review of systems normal other than 12 systems except per HPI.  PMH: No past medical history on file.  FH: No family history on file.  SH:  Social History   Socioeconomic History   Marital status: Single    Spouse name: Not on file   Number of children: Not on file   Years of education: Not on file   Highest education level: Not on file  Occupational History   Not on file  Tobacco Use   Smoking status: Every Day    Current packs/day: 0.50    Types: Cigarettes   Smokeless tobacco: Never  Substance and Sexual Activity   Alcohol use: Yes    Comment: occ   Drug use: Yes    Types: Marijuana   Sexual activity: Not on file  Other Topics Concern   Not on file  Social History Narrative   Not on file   Social Determinants of Health   Financial Resource Strain: Not on file  Food Insecurity: Not on file  Transportation Needs: Not on file  Physical Activity: Not on file  Stress: Not on file  Social Connections: Not on file  Intimate Partner Violence: Not on file    PSH:  Past Surgical History:  Procedure Laterality Date   HERNIA REPAIR      Physical  Exam:  GEN-  laying on side in bed resting RESP-  unlabored, no tripoding, no wheezing, no use  of acessory muscles.  He does have wet quality to his voice.  Tolerating own secretions. Neuro-  CN 2-12 grossly intact and symmetric. EARS- clear EAC NOSE-  edema and mucous OC/OP-  Left sided tonsil edema and erythema and exudate with uvular deviation.  Uvular edema. NECK- supple floor of mouth and supple left neck with reactive lymph nodes.  Patient denies any significant pain CARD-  RRR  CT-  IMPRESSION: 1. Diffuse edema and inflammatory changes extending from the oropharynx to the proximal larynx, extending along the lateral aspect of the left vocal fold, with associated retropharyngeal fluid and inflammatory changes extending into the submandibular and masticator space. Although this involves the left palatine tonsil and the left aspect of the epiglottis, this does not appear centered in either location, concerning for more a diffuse pharyngitis/laryngitis. The airway is narrowed remains patent. 2. A focal low-density collection with peripheral enhancement in the left parapharyngeal soft tissues, favored to represent abscess, measuring approximately 1.5 x 0.9 x 2.8 cm. 3. Paranasal sinus disease, with an air-fluid level in the right maxillary sinus, which can be seen in the setting of acute sinusitis. 4. Emphysema.   A/P: Tonsillitis/Pharyngitis with retropharyngeal effusion and tonsillar phlegmon.  Plan:  Clinically patient looks more like a tonsillitis/phelgmon type picture although he did report left  mandibular molar pain at onset of his infection.  He does have retropharyngeal effusion that can be seen with viral pharyngitis as well.  Significant improvement per patient.  Laryngoscopy showed swelling of left tonsil/pharynx overlying epiglottis on left side but right is widely patent and larynx is clear with some edema of left vocal fold.  Epiglottis appeared non-edematous.  Significant pooling of secretions on left side.  Airway is patent and given significant improvement and  lack of airway symptoms, recommend observation closely tonight.  No role at this time for elective intubation given patent airway and patient reports dramatic improvement with IV steroids/antibiotics already so I would anticipate a similar improvement over next several hours.  Recommend admission to step-down type unit for close monitoring of airway swelling.  OK for PO given no role currently for surgical intervention.  Will re-evaluate with possible repeat laryngoscopy at bedside in a.m.  Discussed with Hospitalist as well.   Taleigh Gero 02/24/2023 12:30 AM

## 2023-02-24 NOTE — Progress Notes (Signed)
..02/24/2023 8:21 AM  Tommy Hunt [id number]  Hospital Day 22    Temp:  [97.7 F (36.5 C)-98.1 F (36.7 C)] 98 F (36.7 C) (11/18 0723) Pulse Rate:  [56-89] 62 (11/18 0723) Resp:  [16-18] 18 (11/18 0723) BP: (108-130)/(70-90) 119/90 (11/18 0723) SpO2:  [75 %-100 %] 93 % (11/18 0723) Weight:  [81.6 kg] 81.6 kg (11/17 1652),     Intake/Output Summary (Last 24 hours) at 02/24/2023 0821 Last data filed at 02/24/2023 0530 Gross per 24 hour  Intake 1100 ml  Output --  Net 1100 ml    Results for orders placed or performed during the hospital encounter of 02/23/23 (from the past 24 hour(s))  Basic metabolic panel     Status: Abnormal   Collection Time: 02/23/23  7:50 PM  Result Value Ref Range   Sodium 136 135 - 145 mmol/L   Potassium 3.3 (L) 3.5 - 5.1 mmol/L   Chloride 102 98 - 111 mmol/L   CO2 25 22 - 32 mmol/L   Glucose, Bld 157 (H) 70 - 99 mg/dL   BUN 15 6 - 20 mg/dL   Creatinine, Ser 5.28 0.61 - 1.24 mg/dL   Calcium 8.6 (L) 8.9 - 10.3 mg/dL   GFR, Estimated >41 >32 mL/min   Anion gap 9 5 - 15  CBC with Differential     Status: Abnormal   Collection Time: 02/23/23  7:50 PM  Result Value Ref Range   WBC 12.7 (H) 4.0 - 10.5 K/uL   RBC 4.68 4.22 - 5.81 MIL/uL   Hemoglobin 13.8 13.0 - 17.0 g/dL   HCT 44.0 10.2 - 72.5 %   MCV 88.0 80.0 - 100.0 fL   MCH 29.5 26.0 - 34.0 pg   MCHC 33.5 30.0 - 36.0 g/dL   RDW 36.6 44.0 - 34.7 %   Platelets 171 150 - 400 K/uL   nRBC 0.0 0.0 - 0.2 %   Neutrophils Relative % 81 %   Neutro Abs 10.2 (H) 1.7 - 7.7 K/uL   Lymphocytes Relative 10 %   Lymphs Abs 1.3 0.7 - 4.0 K/uL   Monocytes Relative 8 %   Monocytes Absolute 1.1 (H) 0.1 - 1.0 K/uL   Eosinophils Relative 0 %   Eosinophils Absolute 0.1 0.0 - 0.5 K/uL   Basophils Relative 0 %   Basophils Absolute 0.0 0.0 - 0.1 K/uL   Immature Granulocytes 1 %   Abs Immature Granulocytes 0.06 0.00 - 0.07 K/uL  Resp panel by RT-PCR (RSV, Flu A&B, Covid) Throat     Status: None    Collection Time: 02/23/23  7:50 PM   Specimen: Throat; Nasal Swab  Result Value Ref Range   SARS Coronavirus 2 by RT PCR NEGATIVE NEGATIVE   Influenza A by PCR NEGATIVE NEGATIVE   Influenza B by PCR NEGATIVE NEGATIVE   Resp Syncytial Virus by PCR NEGATIVE NEGATIVE  Magnesium     Status: None   Collection Time: 02/23/23  7:50 PM  Result Value Ref Range   Magnesium 2.1 1.7 - 2.4 mg/dL  Group A Strep by PCR     Status: Abnormal   Collection Time: 02/23/23  7:51 PM   Specimen: Throat; Sterile Swab  Result Value Ref Range   Group A Strep by PCR DETECTED (A) NOT DETECTED  Culture, blood (Routine X 2) w Reflex to ID Panel     Status: None (Preliminary result)   Collection Time: 02/24/23  1:06 AM   Specimen: BLOOD  Result Value Ref Range  Specimen Description BLOOD RIGHT ARM    Special Requests BOTTLES DRAWN AEROBIC AND ANAEROBIC    Culture      NO GROWTH < 12 HOURS Performed at [facility], [address] [place], [place] [address]    Report Status PENDING   Culture, blood (Routine X 2) w Reflex to ID Panel     Status: None (Preliminary result)   Collection Time: 02/24/23  1:06 AM   Specimen: BLOOD  Result Value Ref Range   Specimen Description BLOOD LEFT HAND    Special Requests BOTTLES DRAWN AEROBIC AND ANAEROBIC    Culture      NO GROWTH < 12 HOURS Performed at [facility], [address] [place], [place] [address]    Report Status PENDING   Protime-INR     Status: Abnormal   Collection Time: 02/24/23  1:07 AM  Result Value Ref Range   Prothrombin Time 15.9 (H) 11.4 - 15.2 seconds   INR 1.3 (H) 0.8 - 1.2  APTT     Status: None   Collection Time: 02/24/23  1:07 AM  Result Value Ref Range   aPTT 34 24 - 36 seconds    SUBJECTIVE:  Patient continues to report significant improvement.  Denies pain.  Denies breathing difficulty.  Wants "real food" instead of liquid diet.  Wanting to go home.  OBJECTIVE:  GEN-  NAD, resting in bed comfortably NOSE-   clear anteriorly OC/OP-  significant improvement in uvular and left tonsillar edema and erythema and exudate.   Continued rightward deviation of uvula. NECK-  supple submental and supple lateral neck with no masses or lesions,  reactive lymphadenopathy  Procedure:  Trans-nasal flexible laryngoscopy:  patient declined procedure this morning  IMPRESSION:  Tonsillitis/Pharyngitis improving  PLAN:  Strep positive on PCR.  Began with tooth ache and looks more like ludwigs spread in neck per Radiology but no submental or mandibular swelling.  Exam looks more like tonsillitis that has spread inferiorly down pharynx esp given PCR positive.  Retropharyngeal effusion and diffuse edema more consistent with viral pharyngitis/laryngitits of which would respond very quickly to Decadron as this did.  Possibly multifactorial.  Clinical exam continues to improve although patient did decline laryngoscopy.  No concern at this time for airway compromise.  Cleared for regular floor status and regular diet.  Will recheck at lunch.  Patient is most likely going to want to continue to leave the hospital, I instructed him on the importance of taking all outpatient medication.  Can consider Augmentin x 14 days/Prednisone 6 day taper upon discharge if patient decides to leave prior to my recheck at lunch.  Tommy Hunt 02/24/2023, 8:21 AM

## 2023-02-24 NOTE — ED Notes (Signed)
Patient notes wanting to leave due to not being able to eat "real food". Pt informed of clear liquid diet as ordered and offered available clear liquids but refused them. With this he notes being uncomfortable and wanting to leave. This RN notified both nocturnist and am admitting hospitalist of situation and patient concern. Per the hospitalist he will see him in rounds and will evaluate and handle the situation then. Patient updated on the hospitalist being contacted and that he will be able to see him today. Patient currently still in room, but is also refusing medications as ordered for this morning due to wanting to see the doctor first. This information will be handed off during shift change for continuity of care.

## 2023-02-24 NOTE — Op Note (Signed)
..  02/24/2023  12:47 AM    Tommy Hunt  [id number]   Pre-Op Dx:  Pharyngitis [J02.9] Tonsillitis [J03.90]  Post-op Dx: Pharyngitis [J02.9] Tonsillitis [J03.90]  Proc: Trans-nasal flexible laryngoscopy  Surg: Roney Mans Vida Nicol  Anes:  Topical Afrin and 2% lidocaine spray  EBL:  None  Comp:  None  Findings:  swelling of left tonsil/pharynx overlying epiglottis on left side but right pharynx/larynx is widely patent and larynx is clear with some edema of left vocal fold. Epiglottis appeared non-edematous. Significant pooling of secretions on left side. Airway is patent.  Mucosal edema throughout nasopharynx and pharynx more centered on left side.  Procedure: With the patient in a comfortable sitting position, the patient's left nasal cavity was anesthetized with Afrin and 2% lidocaine.  A flexible distal chip laryngoscope was inserted into the patient's right nasal cavity. This demonstrated mucosal edema in nasopharynx with clear saliva present.  This was advanced for view of the patient's pharynx and larynx.  This demonstrated pooling of secretions and swelling of left palatine tonsil and left pharyngeal wall down to level of epiglottis.  This was overhanging the left side of the epiglottis but the epiglottis itself was not significantly edematous.  Right epiglottis and right larynx was WNL.  Airway was visualized and widely patent.  Mild edema of left TVF and ventricle.    Following this  The patient was returned to ER in stable condition  Plan:   Admission for IV antibiotics and steroids, appears to be tonsillar/pharyngeal in nature.  Will plan on repeat laryngoscopy in a.m. given significant improvement already by patient.     Roney Mans Samuell Knoble 12:47 AM 02/24/2023

## 2023-02-24 NOTE — ED Notes (Addendum)
Pt requesting to speak with doctor about diet order and going home. States he is feeling better, breathing is good and swelling is decreased. ENT MD in room evaluating pt. Verbal order given for regular diet. Pt states he is willing to wait until noon time to see MD again. Pt compliant with meds now.

## 2023-02-24 NOTE — H&P (Signed)
History and Physical    Bassil Bhuiyan WUJ:[id number] DOB: 10-13-1975 DOA: 02/23/2023  Referring MD/NP/PA:   PCP: Pcp, No   Patient coming from:  The patient is coming from home.     Chief Complaint: tooth pain and sore throat  HPI: Tommy Hunt is a 47 y.o. male with medical history significant of polysubstance abuse (tobacco, cocaine, marijuana, opiate), who presents with tooth pain and sore throat.  Patient states that his symptoms started yesterday. He reports it initially began with a tooth ache in left mandibular region.  The pain has been progressively worsening, which is constant, sharp, severe, radiating to his left neck.  The pain is aggravated by swallowing.  Denies fever or chills.  No cough, SOB, chest pain.  Denies nausea, vomiting, diarrhea or abdominal pain.  No symptoms of UTI.  His voice is hoarse  Data reviewed independently and ED Course: pt was found to have WBC 12.7, positive rapid strep A, negative PCR for COVID, flu and RSV, GFR> 60, potassium 3.3.  Temperature normal, blood pressure 116/76, heart rate 89, RR 18, oxygen saturation 96% on room air.  Patient is admitted to stepdown as inpatient.  Dr. Andee Poles of ENT is consulted.  EKG:  Not done in ED, will get one.     CT-neck soft tissue: 1. Diffuse edema and inflammatory changes extending from the oropharynx to the proximal larynx, extending along the lateral aspect of the left vocal fold, with associated retropharyngeal fluid and inflammatory changes extending into the submandibular and masticator space. Although this involves the left palatine tonsil and the left aspect of the epiglottis, this does not appear centered in either location, concerning for more a diffuse pharyngitis/laryngitis. The airway is narrowed remains patent. 2. A focal low-density collection with peripheral enhancement in the left parapharyngeal soft tissues, favored to represent abscess, measuring approximately 1.5 x 0.9 x 2.8 cm. 3.  Paranasal sinus disease, with an air-fluid level in the right maxillary sinus, which can be seen in the setting of acute sinusitis. 4. Emphysema.   Emphysema (ICD10-J43.9).   Review of Systems:   General: no fevers, chills, no body weight gain, fatigue HEENT: no blurry vision, hearing changes. Has tooth pain and sore throat Respiratory: no dyspnea, coughing, wheezing CV: no chest pain, no palpitations GI: no nausea, vomiting, abdominal pain, diarrhea, constipation GU: no dysuria, burning on urination, increased urinary frequency, hematuria  Ext: no leg edema Neuro: no unilateral weakness, numbness, or tingling, no vision change or hearing loss Skin: no rash, no skin tear. MSK: No muscle spasm, no deformity, no limitation of range of movement in spin Heme: No easy bruising.  Travel history: No recent long distant travel.   Allergy: No Known Allergies  Past Medical History:  Diagnosis Date   Inguinal hernia    Smoking    Substance abuse (HCC)     Past Surgical History:  Procedure Laterality Date   HERNIA REPAIR      Social History:  reports that he has been smoking cigarettes. He has never used smokeless tobacco. He reports current alcohol use. He reports current drug use. Drug: Marijuana.  Family History: No family history on file.  I have reviewed with patient about family medical history, but patient states that he does not know any detailed information about his family medical history.  Prior to Admission medications   Medication Sig Start Date End Date Taking? Authorizing Provider  amoxicillin-clavulanate (AUGMENTIN) 875-125 MG tablet Take 1 tablet by mouth 2 (two) times daily for  10 days. 02/23/23 03/05/23 Yes Dougherty, Lauren A, PA-C  albuterol (VENTOLIN HFA) 108 (90 Base) MCG/ACT inhaler Inhale 1-2 puffs into the lungs every 4 (four) hours as needed for wheezing or shortness of breath. Patient not taking: Reported on 06/06/2021 11/27/20   Georga Hacking, MD   naloxone [facility]) nasal spray 4 mg/0.1 mL Administer 1 spray as needed into one nostril in the case of decreased breathing and responsiveness thought to be due to narcotics overdose.  Call 911 and repeat dose every 2 to 3 minutes in alternating nostrils as needed until EMS arrives. 11/08/21   Loleta Rose, MD    Physical Exam: Vitals:   02/23/23 2008 02/23/23 2012 02/23/23 2202 02/24/23 0016  BP: 116/70 116/70 116/76 125/81  Pulse: 86 86 68 81  Resp:  17 18 17   Temp: 98.1 F (36.7 C) 98.1 F (36.7 C) 98.1 F (36.7 C) 97.7 F (36.5 C)  TempSrc: Oral Oral  Oral  SpO2: 96% 96% 96% 100%  Weight:      Height:       General: Not in acute distress HEENT: Exam is limited since patient cannot open his mouth wide.  Has erythema in pharynx and left tonsil, with left tonsillar swelling, with increased secretion.       Eyes: PERRL, EOMI, no jaundice       ENT: No discharge from the ears and nose       Neck: No JVD, no bruit, no mass felt. Heme: has left neck lymph node enlargement. Cardiac: S1/S2, RRR, No murmurs, No gallops or rubs. Respiratory: No rales, wheezing, rhonchi or rubs. GI: Soft, nondistended, nontender, no rebound pain, no organomegaly, BS present. GU: No hematuria Ext: No pitting leg edema bilaterally. 1+DP/PT pulse bilaterally. Musculoskeletal: No joint deformities, No joint redness or warmth, no limitation of ROM in spin. Skin: No rashes.  Neuro: Alert, oriented X3, cranial nerves II-XII grossly intact, moves all extremities normally.  Psych: Patient is not psychotic, no suicidal or hemocidal ideation.  Labs on Admission: I have personally reviewed following labs and imaging studies  CBC: Recent Labs  Lab 02/23/23 1950  WBC 12.7*  NEUTROABS 10.2*  HGB 13.8  HCT 41.2  MCV 88.0  PLT 171   Basic Metabolic Panel: Recent Labs  Lab 02/23/23 1950  NA 136  K 3.3*  CL 102  CO2 25  GLUCOSE 157*  BUN 15  CREATININE 0.91  CALCIUM 8.6*  MG 2.1   GFR: Estimated  Creatinine Clearance: 112.5 mL/min (by C-G formula based on SCr of 0.91 mg/dL). Liver Function Tests: No results for input(s): "AST", "ALT", "ALKPHOS", "BILITOT", "PROT", "ALBUMIN" in the last 168 hours. No results for input(s): "LIPASE", "AMYLASE" in the last 168 hours. No results for input(s): "AMMONIA" in the last 168 hours. Coagulation Profile: No results for input(s): "INR", "PROTIME" in the last 168 hours. Cardiac Enzymes: No results for input(s): "CKTOTAL", "CKMB", "CKMBINDEX", "TROPONINI" in the last 168 hours. BNP (last 3 results) No results for input(s): "PROBNP" in the last 8760 hours. HbA1C: No results for input(s): "HGBA1C" in the last 72 hours. CBG: No results for input(s): "GLUCAP" in the last 168 hours. Lipid Profile: No results for input(s): "CHOL", "HDL", "LDLCALC", "TRIG", "CHOLHDL", "LDLDIRECT" in the last 72 hours. Thyroid Function Tests: No results for input(s): "TSH", "T4TOTAL", "FREET4", "T3FREE", "THYROIDAB" in the last 72 hours. Anemia Panel: No results for input(s): "VITAMINB12", "FOLATE", "FERRITIN", "TIBC", "IRON", "RETICCTPCT" in the last 72 hours. Urine analysis: No results found for: "COLORURINE", "APPEARANCEUR", "  LABSPEC", "PHURINE", "GLUCOSEU", "HGBUR", "BILIRUBINUR", "KETONESUR", "PROTEINUR", "UROBILINOGEN", "NITRITE", "LEUKOCYTESUR" Sepsis Labs: @LABRCNTIP (procalcitonin:4,lacticidven:4) ) Recent Results (from the past 240 hour(s))  Resp panel by RT-PCR (RSV, Flu A&B, Covid) Throat     Status: None   Collection Time: 02/23/23  7:50 PM   Specimen: Throat; Nasal Swab  Result Value Ref Range Status   SARS Coronavirus 2 by RT PCR NEGATIVE NEGATIVE Final    Comment: (NOTE) SARS-CoV-2 target nucleic acids are NOT DETECTED.  The SARS-CoV-2 RNA is generally detectable in upper respiratory specimens during the acute phase of infection. The lowest concentration of SARS-CoV-2 viral copies this assay can detect is 138 copies/mL. A negative result does not  preclude SARS-Cov-2 infection and should not be used as the sole basis for treatment or other patient management decisions. A negative result may occur with  improper specimen collection/handling, submission of specimen other than nasopharyngeal swab, presence of viral mutation(s) within the areas targeted by this assay, and inadequate number of viral copies(<138 copies/mL). A negative result must be combined with clinical observations, patient history, and epidemiological information. The expected result is Negative.  Fact Sheet for Patients:  BloggerCourse.com  Fact Sheet for Healthcare Providers:  SeriousBroker.it  This test is no t yet approved or cleared by the [place] FDA and  has been authorized for detection and/or diagnosis of SARS-CoV-2 by FDA under an Emergency Use Authorization (EUA). This EUA will remain  in effect (meaning this test can be used) for the duration of the COVID-19 declaration under Section 564(b)(1) of the Act, 21 U.S.C.section 360bbb-3(b)(1), unless the authorization is terminated  or revoked sooner.       Influenza A by PCR NEGATIVE NEGATIVE Final   Influenza B by PCR NEGATIVE NEGATIVE Final    Comment: (NOTE) The Xpert Xpress SARS-CoV-2/FLU/RSV plus assay is intended as an aid in the diagnosis of influenza from Nasopharyngeal swab specimens and should not be used as a sole basis for treatment. Nasal washings and aspirates are unacceptable for Xpert Xpress SARS-CoV-2/FLU/RSV testing.  Fact Sheet for Patients: BloggerCourse.com  Fact Sheet for Healthcare Providers: SeriousBroker.it  This test is not yet approved or cleared by the [place] FDA and has been authorized for detection and/or diagnosis of SARS-CoV-2 by FDA under an Emergency Use Authorization (EUA). This EUA will remain in effect (meaning this test can be used) for the  duration of the COVID-19 declaration under Section 564(b)(1) of the Act, 21 U.S.C. section 360bbb-3(b)(1), unless the authorization is terminated or revoked.     Resp Syncytial Virus by PCR NEGATIVE NEGATIVE Final    Comment: (NOTE) Fact Sheet for Patients: BloggerCourse.com  Fact Sheet for Healthcare Providers: SeriousBroker.it  This test is not yet approved or cleared by the [place] FDA and has been authorized for detection and/or diagnosis of SARS-CoV-2 by FDA under an Emergency Use Authorization (EUA). This EUA will remain in effect (meaning this test can be used) for the duration of the COVID-19 declaration under Section 564(b)(1) of the Act, 21 U.S.C. section 360bbb-3(b)(1), unless the authorization is terminated or revoked.  Performed at [facility], [address] [place], [place] [address]   Group A Strep by PCR     Status: Abnormal   Collection Time: 02/23/23  7:51 PM   Specimen: Throat; Sterile Swab  Result Value Ref Range Status   Group A Strep by PCR DETECTED (A) NOT DETECTED Final    Comment: Performed at [facility], [address], [place], [place] [address]  Radiological Exams on Admission: CT Soft Tissue Neck W Contrast  Result Date: 02/23/2023 CLINICAL DATA:  Epiglottitis or tonsillitis suspected EXAM: CT NECK WITH CONTRAST TECHNIQUE: Multidetector CT imaging of the neck was performed using the standard protocol following the bolus administration of intravenous contrast. RADIATION DOSE REDUCTION: This exam was performed according to the departmental dose-optimization program which includes automated exposure control, adjustment of the mA and/or kV according to patient size and/or use of iterative reconstruction technique. CONTRAST:  75mL OMNIPAQUE IOHEXOL 300 MG/ML  SOLN COMPARISON:  None Available. FINDINGS: Pharynx and larynx: Diffuseedema extending from the oropharynx (series 6,  image 50) to the hypopharynx and proximal larynx, extending along the lateral aspect the left vocal fold (series 6, image 93). Although this involves the left palatine tonsil, this does not appear centered in the tonsil. Extensive low density in the left parapharyngeal soft tissues and fat (series 6, image 70), concerning for phlegmon, with a focal low-density collection with peripheral enhancement that is favored to represent abscess, measuring approximately 1.5 x 0.9 x 2.8 cm (AP x TR x CC) (series 6, image 72 and series 5, image 60). Edema extends into the left aspect of the epiglottis (series 4, image 39). Retropharyngeal fluid and edema, which extends from the level of the oropharynx are (series 6, image 51) to the hypopharynx (series 6, image 83 and series 4, image 36). Inflammatory fat stranding in the left parapharyngeal fat, extending into the left submandibular (series 6, image 88) and masticator spaces (series 6, image 70). No periapical lucency or significant dental caries to suggest an odontogenic origin. The airway is narrowed but remains patent. Salivary glands: No inflammation, mass, or stone. Thyroid: Normal. Lymph nodes: Multiple prominent lymph nodes, which retain normal morphology. No abnormal density or abnormal morphology lymph nodes. Vascular: Patent. Limited intracranial: No acute finding. Visualized orbits: Negative. Mastoids and visualized paranasal sinuses: Mucosal thickening and air-fluid level in the right maxillary sinus. Left maxillary mucous retention cyst. Mucosal thickening in the right sphenoid sinus and bilateral ethmoid air cells, as well as the bilateral frontal sinuses. The mastoids are well aerated. Skeleton: No acute osseous abnormality. Upper chest: Paraseptal emphysema. No focal pulmonary opacity or pleural effusion. IMPRESSION: 1. Diffuse edema and inflammatory changes extending from the oropharynx to the proximal larynx, extending along the lateral aspect of the left  vocal fold, with associated retropharyngeal fluid and inflammatory changes extending into the submandibular and masticator space. Although this involves the left palatine tonsil and the left aspect of the epiglottis, this does not appear centered in either location, concerning for more a diffuse pharyngitis/laryngitis. The airway is narrowed remains patent. 2. A focal low-density collection with peripheral enhancement in the left parapharyngeal soft tissues, favored to represent abscess, measuring approximately 1.5 x 0.9 x 2.8 cm. 3. Paranasal sinus disease, with an air-fluid level in the right maxillary sinus, which can be seen in the setting of acute sinusitis. 4. Emphysema. Emphysema (ICD10-J43.9). These results were called by telephone at the time of interpretation on 02/23/2023 at 10:08 pm to provider CUTHRIELL, who verbally acknowledged these results. Electronically Signed   By: Wiliam Ke M.D.   On: 02/23/2023 22:08      Assessment/Plan Principal Problem:   Tonsillitis Active Problems:   Streptococcal pharyngitis   Hypokalemia   Substance abuse (HCC)   Smoking   Assessment and Plan:  Streptococcal pharyngitis and tonsillitis: pt has streptococcal pharyngitis and tonsillitis, with retropharyngeal effusion and tonsillar phlegmon.  Airway is patent.  No stridor.  Patient does not meet criteria for sepsis (WBC 12.7, heart rate 89, RR 18, temperature normal).  Consulted ENT, Dr. Andee Poles. -Admit to stepdown as inpatient for close monitoring and airway -IV Unasyn -Decadron 10 mg every 8 hours -Follow-up of blood culture -As needed Zofran, Tylenol, ketorolac IV -Liquid diet now per Dr .Andee Poles -IV fluid: 1 L normal saline, then 100 cc/h  Hypokalemia: Potassium 3.3 -Repleted potassium -Check magnesium level  Substance abuse (smoking, cocaine, marijuana, opiate) -Did counseling about importance of quitting substance use -UDS  Smoking -Nicotine patch    DVT ppx: SQ Heparin     Code Status: Full code     Family Communication: not done, no family member is at bed side.   Disposition Plan:  Anticipate discharge back to previous environment  Consults called: Dr. Andee Poles of ENT is consulted.   Admission status and Level of care: Stepdown:    as inpt      Dispo: The patient is from: Home              Anticipated d/c is to: Home              Anticipated d/c date is: 2 days              Patient currently is not medically stable to d/c.    Severity of Illness:  The appropriate patient status for this patient is INPATIENT. Inpatient status is judged to be reasonable and necessary in order to provide the required intensity of service to ensure the patient's safety. The patient's presenting symptoms, physical exam findings, and initial radiographic and laboratory data in the context of their chronic comorbidities is felt to place them at high risk for further clinical deterioration. Furthermore, it is not anticipated that the patient will be medically stable for discharge from the hospital within 2 midnights of admission.   * I certify that at the point of admission it is my clinical judgment that the patient will require inpatient hospital care spanning beyond 2 midnights from the point of admission due to high intensity of service, high risk for further deterioration and high frequency of surveillance required.*       Date of Service 02/24/2023    Lorretta Harp [facility]   If 7PM-7AM, please contact night-coverage [email or web address] 02/24/2023, 1:15 AM

## 2023-02-24 NOTE — Discharge Summary (Signed)
Physician Discharge Summary   Patient: Tommy Hunt MRN: [id number] DOB: 1975-06-16  Admit date:     02/23/2023  Discharge date: 02/24/23  Discharge Physician: Loyce Dys   PCP: Pcp, No   Recommendations at discharge:   Follow-up with ENT  Discharge Diagnoses: Streptococcal pharyngitis and tonsillitis:  Hypokalemia:  Substance abuse (smoking, cocaine, marijuana, opiate) Smoking    Hospital Course:   Tommy Hunt is a 47 y.o. male with medical history significant of polysubstance abuse (tobacco, cocaine, marijuana, opiate), who presents with tooth pain and sore throat. CT scan of the neck showed diffuse edema and inflammatory changes extending from the oropharynx to the proximal larynx, extending along the lateral aspect of the left vocal fold, with associated retropharyngeal fluid and inflammatory changes extending into the submandibular and masticator space. Although this involves the left palatine tonsil and the left aspect of the epiglottis, this does not appear centered in either location, concerning for more a diffuse pharyngitis/laryngitis.  Patient was seen by ENT surgeon antibiotics was continued and have been cleared for discharge on oral antibiotics and steroid therapy and will follow-up with the ENT in the next 48 hours at the clinic  Consultants: ENT Procedures performed: None Disposition: Home Diet recommendation:  Cardiac diet DISCHARGE MEDICATION: Allergies as of 02/24/2023   No Known Allergies      Medication List     TAKE these medications    albuterol 108 (90 Base) MCG/ACT inhaler Commonly known as: VENTOLIN HFA Inhale 1-2 puffs into the lungs every 4 (four) hours as needed for wheezing or shortness of breath.   amoxicillin-clavulanate 875-125 MG tablet Commonly known as: AUGMENTIN Take 1 tablet by mouth 2 (two) times daily for 14 days.   naloxone 4 MG/0.1ML Liqd nasal spray kit Commonly known as: NARCAN Administer 1 spray as needed into  one nostril in the case of decreased breathing and responsiveness thought to be due to narcotics overdose.  Call 911 and repeat dose every 2 to 3 minutes in alternating nostrils as needed until EMS arrives.   predniSONE 10 MG tablet Commonly known as: DELTASONE Take 4 tablets (40 mg total) by mouth daily with breakfast for 2 days, THEN 2 tablets (20 mg total) daily with breakfast for 2 days, THEN 1 tablet (10 mg total) daily with breakfast for 2 days. Start taking on: February 24, 2023        Follow-up Information     Vaught, Roney Mans, MD .   Specialty: Otolaryngology Contact information: [address] [place] [address] [phone]                Discharge Exam: Ceasar Mons Weights   02/23/23 1652  Weight: 81.6 kg   General: Not in acute distress Heme: has left neck lymph node enlargement. Cardiac: S1/S2, RRR, No murmurs, No gallops or rubs. Respiratory: No rales, wheezing, rhonchi or rubs. GI: Soft, nondistended, nontender GU: No hematuria Ext: No pitting leg edema bilaterally. 1+DP/PT pulse bilaterally. Musculoskeletal: No joint deformities, No joint redness or warmth, no limitation of ROM in spin. Skin: No rashes.  Neuro: Alert, oriented X3, cranial nerves II-XII grossly intact, Psych: Patient is not psychotic, no suicidal or hemocidal ideation.    Condition at discharge: good  The results of significant diagnostics from this hospitalization (including imaging, microbiology, ancillary and laboratory) are listed below for reference.   Imaging Studies: CT Soft Tissue Neck W Contrast  Result Date: 02/23/2023 CLINICAL DATA:  Epiglottitis or tonsillitis suspected EXAM: CT NECK WITH  CONTRAST TECHNIQUE: Multidetector CT imaging of the neck was performed using the standard protocol following the bolus administration of intravenous contrast. RADIATION DOSE REDUCTION: This exam was performed according to the departmental dose-optimization program  which includes automated exposure control, adjustment of the mA and/or kV according to patient size and/or use of iterative reconstruction technique. CONTRAST:  75mL OMNIPAQUE IOHEXOL 300 MG/ML  SOLN COMPARISON:  None Available. FINDINGS: Pharynx and larynx: Diffuseedema extending from the oropharynx (series 6, image 50) to the hypopharynx and proximal larynx, extending along the lateral aspect the left vocal fold (series 6, image 93). Although this involves the left palatine tonsil, this does not appear centered in the tonsil. Extensive low density in the left parapharyngeal soft tissues and fat (series 6, image 70), concerning for phlegmon, with a focal low-density collection with peripheral enhancement that is favored to represent abscess, measuring approximately 1.5 x 0.9 x 2.8 cm (AP x TR x CC) (series 6, image 72 and series 5, image 60). Edema extends into the left aspect of the epiglottis (series 4, image 39). Retropharyngeal fluid and edema, which extends from the level of the oropharynx are (series 6, image 51) to the hypopharynx (series 6, image 83 and series 4, image 36). Inflammatory fat stranding in the left parapharyngeal fat, extending into the left submandibular (series 6, image 88) and masticator spaces (series 6, image 70). No periapical lucency or significant dental caries to suggest an odontogenic origin. The airway is narrowed but remains patent. Salivary glands: No inflammation, mass, or stone. Thyroid: Normal. Lymph nodes: Multiple prominent lymph nodes, which retain normal morphology. No abnormal density or abnormal morphology lymph nodes. Vascular: Patent. Limited intracranial: No acute finding. Visualized orbits: Negative. Mastoids and visualized paranasal sinuses: Mucosal thickening and air-fluid level in the right maxillary sinus. Left maxillary mucous retention cyst. Mucosal thickening in the right sphenoid sinus and bilateral ethmoid air cells, as well as the bilateral frontal sinuses.  The mastoids are well aerated. Skeleton: No acute osseous abnormality. Upper chest: Paraseptal emphysema. No focal pulmonary opacity or pleural effusion. IMPRESSION: 1. Diffuse edema and inflammatory changes extending from the oropharynx to the proximal larynx, extending along the lateral aspect of the left vocal fold, with associated retropharyngeal fluid and inflammatory changes extending into the submandibular and masticator space. Although this involves the left palatine tonsil and the left aspect of the epiglottis, this does not appear centered in either location, concerning for more a diffuse pharyngitis/laryngitis. The airway is narrowed remains patent. 2. A focal low-density collection with peripheral enhancement in the left parapharyngeal soft tissues, favored to represent abscess, measuring approximately 1.5 x 0.9 x 2.8 cm. 3. Paranasal sinus disease, with an air-fluid level in the right maxillary sinus, which can be seen in the setting of acute sinusitis. 4. Emphysema. Emphysema (ICD10-J43.9). These results were called by telephone at the time of interpretation on 02/23/2023 at 10:08 pm to provider CUTHRIELL, who verbally acknowledged these results. Electronically Signed   By: Wiliam Ke M.D.   On: 02/23/2023 22:08    Microbiology: Results for orders placed or performed during the hospital encounter of 02/23/23  Resp panel by RT-PCR (RSV, Flu A&B, Covid) Throat     Status: None   Collection Time: 02/23/23  7:50 PM   Specimen: Throat; Nasal Swab  Result Value Ref Range Status   SARS Coronavirus 2 by RT PCR NEGATIVE NEGATIVE Final    Comment: (NOTE) SARS-CoV-2 target nucleic acids are NOT DETECTED.  The SARS-CoV-2 RNA is generally detectable in  upper respiratory specimens during the acute phase of infection. The lowest concentration of SARS-CoV-2 viral copies this assay can detect is 138 copies/mL. A negative result does not preclude SARS-Cov-2 infection and should not be used as the sole  basis for treatment or other patient management decisions. A negative result may occur with  improper specimen collection/handling, submission of specimen other than nasopharyngeal swab, presence of viral mutation(s) within the areas targeted by this assay, and inadequate number of viral copies(<138 copies/mL). A negative result must be combined with clinical observations, patient history, and epidemiological information. The expected result is Negative.  Fact Sheet for Patients:  BloggerCourse.com  Fact Sheet for Healthcare Providers:  SeriousBroker.it  This test is no t yet approved or cleared by the [place] FDA and  has been authorized for detection and/or diagnosis of SARS-CoV-2 by FDA under an Emergency Use Authorization (EUA). This EUA will remain  in effect (meaning this test can be used) for the duration of the COVID-19 declaration under Section 564(b)(1) of the Act, 21 U.S.C.section 360bbb-3(b)(1), unless the authorization is terminated  or revoked sooner.       Influenza A by PCR NEGATIVE NEGATIVE Final   Influenza B by PCR NEGATIVE NEGATIVE Final    Comment: (NOTE) The Xpert Xpress SARS-CoV-2/FLU/RSV plus assay is intended as an aid in the diagnosis of influenza from Nasopharyngeal swab specimens and should not be used as a sole basis for treatment. Nasal washings and aspirates are unacceptable for Xpert Xpress SARS-CoV-2/FLU/RSV testing.  Fact Sheet for Patients: BloggerCourse.com  Fact Sheet for Healthcare Providers: SeriousBroker.it  This test is not yet approved or cleared by the [place] FDA and has been authorized for detection and/or diagnosis of SARS-CoV-2 by FDA under an Emergency Use Authorization (EUA). This EUA will remain in effect (meaning this test can be used) for the duration of the COVID-19 declaration under Section 564(b)(1) of the Act,  21 U.S.C. section 360bbb-3(b)(1), unless the authorization is terminated or revoked.     Resp Syncytial Virus by PCR NEGATIVE NEGATIVE Final    Comment: (NOTE) Fact Sheet for Patients: BloggerCourse.com  Fact Sheet for Healthcare Providers: SeriousBroker.it  This test is not yet approved or cleared by the [place] FDA and has been authorized for detection and/or diagnosis of SARS-CoV-2 by FDA under an Emergency Use Authorization (EUA). This EUA will remain in effect (meaning this test can be used) for the duration of the COVID-19 declaration under Section 564(b)(1) of the Act, 21 U.S.C. section 360bbb-3(b)(1), unless the authorization is terminated or revoked.  Performed at [facility], [address] [place], [place] [address]   Group A Strep by PCR     Status: Abnormal   Collection Time: 02/23/23  7:51 PM   Specimen: Throat; Sterile Swab  Result Value Ref Range Status   Group A Strep by PCR DETECTED (A) NOT DETECTED Final    Comment: Performed at [facility], [address] [place], [place] [address]  Culture, blood (Routine X 2) w Reflex to ID Panel     Status: None (Preliminary result)   Collection Time: 02/24/23  1:06 AM   Specimen: BLOOD  Result Value Ref Range Status   Specimen Description BLOOD RIGHT ARM  Final   Special Requests BOTTLES DRAWN AEROBIC AND ANAEROBIC  Final   Culture   Final    NO GROWTH < 12 HOURS Performed at [facility], [address] [place], [place] [address]    Report Status PENDING  Incomplete  Culture, blood (Routine X 2) w Reflex to ID Panel     Status: None (Preliminary result)   Collection Time: 02/24/23  1:06 AM   Specimen: BLOOD  Result Value Ref Range Status   Specimen Description BLOOD LEFT HAND  Final   Special Requests BOTTLES DRAWN AEROBIC AND ANAEROBIC  Final   Culture   Final    NO GROWTH < 12 HOURS Performed at [facility],  [address] [place], [place] [address]    Report Status PENDING  Incomplete    Labs: CBC: Recent Labs  Lab 02/23/23 1950 02/24/23 0817  WBC 12.7* 12.2*  NEUTROABS 10.2*  --   HGB 13.8 13.7  HCT 41.2 42.0  MCV 88.0 88.6  PLT 171 173   Basic Metabolic Panel: Recent Labs  Lab 02/23/23 1950 02/24/23 0817  NA 136 140  K 3.3* 4.0  CL 102 104  CO2 25 25  GLUCOSE 157* 153*  BUN 15 17  CREATININE 0.91 0.81  CALCIUM 8.6* 8.9  MG 2.1  --    Liver Function Tests: No results for input(s): "AST", "ALT", "ALKPHOS", "BILITOT", "PROT", "ALBUMIN" in the last 168 hours. CBG: No results for input(s): "GLUCAP" in the last 168 hours.  Discharge time spent:  37 minutes.  Signed: Loyce Dys, MD [facility] 02/24/2023

## 2023-03-01 LAB — CULTURE, BLOOD (ROUTINE X 2)
Culture: NO GROWTH
Culture: NO GROWTH

## 2023-08-15 ENCOUNTER — Ambulatory Visit: Payer: Self-pay | Admitting: Family Medicine

## 2023-08-15 ENCOUNTER — Encounter: Payer: Self-pay | Admitting: Family Medicine

## 2023-08-15 DIAGNOSIS — N341 Nonspecific urethritis: Secondary | ICD-10-CM

## 2023-08-15 DIAGNOSIS — Z113 Encounter for screening for infections with a predominantly sexual mode of transmission: Secondary | ICD-10-CM

## 2023-08-15 LAB — GRAM STAIN

## 2023-08-15 MED ORDER — DOXYCYCLINE HYCLATE 100 MG PO TABS: 100.0000 mg | ORAL_TABLET | Freq: Two times a day (BID) | ORAL | Status: AC

## 2023-08-15 NOTE — Progress Notes (Signed)
 Pt here for std screening. Gram stain reviewed with pt and treated per standing order. The patient was dispensed doxycyline #14 today. I provided counseling today regarding the medication. We discussed the medication, the side effects and when to call clinic. Patient given the opportunity to ask questions. Questions answered.  Caren Channel, RN

## 2023-08-15 NOTE — Progress Notes (Signed)
 [facility] Department STI clinic [address] [place] [address] Main phone: [phone]  STI screening visit  Subjective:  Tommy Hunt is a 48 y.o. male being seen today for an STI screening visit. The patient reports they do have symptoms.    Patient has the following medical conditions:  Patient Active Problem List   Diagnosis Date Noted   Tonsillitis 02/24/2023   Hypokalemia 02/24/2023   Streptococcal pharyngitis 02/24/2023   Smoking    Substance abuse (HCC)    Inguinal hernia    Chief Complaint  Patient presents with   SEXUALLY TRANSMITTED DISEASE    HPI Patient reports to clinic with few days of penile discharge  STI screening history: Last HIV test per patient/review of record was No results found for: "HMHIVSCREEN"  Lab Results  Component Value Date   HIV Non Reactive 02/24/2023    Last HEPC test per patient/review of record was No results found for: "HMHEPCSCREEN" No components found for: "HEPC"   Last HEPB test per patient/review of record was No components found for: "HMHEPBSCREEN"   Fertility: Does the patient or their partner desires a pregnancy in the next year? No  Screening for MPX risk: Does the patient have an unexplained rash? No Is the patient MSM? No Does the patient endorse multiple sex partners or anonymous sex partners? No Did the patient have close or sexual contact with a person diagnosed with MPX? No Has the patient traveled outside the [place]  where MPX is endemic? No Is there a high clinical suspicion for MPX-- evidenced by one of the following No  -Unlikely to be chickenpox  -Lymphadenopathy  -Rash that present in same phase of evolution on any given body part  See flowsheet for further details and programmatic requirements  Hyperlink available at the top of the signed note in blue.  Flow sheet content below:  Pregnancy Intention Screening Does the patient want to become pregnant in the next  year?: No Does the patient's partner want to become pregnant in the next year?: No Would the patient like to discuss contraceptive options today?: N/A Reason For STD Screen STD Screening: Has symptoms Have you ever had an STD?: Yes History of Antibiotic use in the past 2 weeks?: No STD Symptoms Discharge: Yes Risk Factors for Hep B Household, sexual, or needle sharing contact of a person infected with Hep B: No Sexual contact with a person who uses drugs not as prescribed?: No Currently or Ever used drugs not as prescribed: No HIV Positive: No PRep Patient: No Men who have sex with men: No Have Hepatitis C: No History of Incarceration: Yes History of Homeslessness?: No Anal sex following anal drug use?: No Risk Factors for Hep C Currently using drugs not as prescribed: No Sexual partner(s) currently using drugs as not prescribed: No History of drug use: No HIV Positive: No People with a history of incarceration: Yes People born between the years of 5 and 92: No Hepatitis Counseling Hep B Counseling: Patient declines testing for Hep B today Hep C Counseling: Patient declines testing for Hep C today Abuse History Has patient ever been abused physically?: No Has patient ever been abused sexually?: No Does patient feel they have a problem with Anxiety?: No Does patient feel they have a problem with Depression?: No Counseling Medication side effects discussed with patient?: Yes Contact card(s) given to patient: Yes Patient counseled to abstain from sex for: 14 days Patient counseled to abstain from sex for?: 7 days  post partner's treatment Patient counseled to use condoms with all sex: Condoms declined RTC in 2-3 weeks for test results: Yes Clinic will call if test results abnormal before test result appt.: Yes Patient to return to the clinic in 3 months for TOC: Yes BCM given today through [facility] clinic: No Immunizations: Referred Test results given to patient Patient  counseled to use condoms with all sex: Condoms declined STD Treatment Patient counseled to abstain from sex for: 14 days Patient counseled to abstain from sex for?: 7 days post partner's treatment   There is no immunization history on file for this patient.  The following portions of the patient's history were reviewed and updated as appropriate: allergies, current medications, past medical history, past social history, past surgical history and problem list.  Objective:  There were no vitals filed for this visit.  Physical Exam Exam conducted with a chaperone present Susanna Epley CMA).  Constitutional:      Appearance: Normal appearance.  HENT:     Head: Normocephalic and atraumatic.     Comments: No nits or hair loss    Mouth/Throat:     Mouth: Mucous membranes are moist. No oral lesions.     Pharynx: Oropharynx is clear. No oropharyngeal exudate or posterior oropharyngeal erythema.  Eyes:     General:        Right eye: No discharge.        Left eye: No discharge.     Conjunctiva/sclera:     Right eye: Right conjunctiva is not injected. No exudate.    Left eye: Left conjunctiva is not injected. No exudate. Pulmonary:     Effort: Pulmonary effort is normal.  Abdominal:     General: Abdomen is flat.     Palpations: Abdomen is soft. There is no hepatomegaly or mass.     Tenderness: There is no abdominal tenderness. There is no rebound.     Hernia: There is no hernia in the left inguinal area or right inguinal area.  Genitourinary:    Pubic Area: No rash or pubic lice (no nits).      Penis: Discharge present. No tenderness, swelling or lesions.      Testes: Normal.     Epididymis:     Right: Normal. No mass or tenderness.     Left: Normal. No mass or tenderness.     Rectum: Normal. No tenderness (no lesions or discharge).     Comments: Penile Discharge Amount: small Color:  yellow-green Lymphadenopathy:     Head:     Right side of head: No preauricular or posterior  auricular adenopathy.     Left side of head: No preauricular or posterior auricular adenopathy.     Cervical: No cervical adenopathy.     Upper Body:     Right upper body: No supraclavicular, axillary or epitrochlear adenopathy.     Left upper body: No supraclavicular, axillary or epitrochlear adenopathy.     Lower Body: No right inguinal adenopathy. No left inguinal adenopathy.  Skin:    General: Skin is warm and dry.     Findings: No lesion or rash.  Neurological:     Mental Status: He is alert and oriented to person, place, and time.     Assessment and Plan:  Tommy Hunt is a 48 y.o. male presenting to the [facility] Department for STI screening  1. Screening for venereal disease (Primary)  - Chlamydia/GC NAA, Confirmation - Gram stain - Gonococcus culture  2. NGU (nongonococcal  urethritis)  -d oxycycline (VIBRA-TABS) 100 MG tablet; Take 1 tablet (100 mg total) by mouth 2 (two) times daily for 7 days.   Patient does have STI symptoms Patient accepted all screenings including  urine GC/Chlamydia, and blood work for HIV/Syphilis. Patient meets criteria for HepB screening? Yes. Ordered? declined Patient meets criteria for HepC screening? Yes. Ordered? declined Recommended condom use with all sex Discussed importance of condom use for STI prevention  Treat positive test results per standing order. Discussed time line for [facility] results and that patient will be called with positive results and encouraged patient to call if he had not heard in 2 weeks Recommended repeat testing in 3 months with positive results. Recommended returning for continued or worsening symptoms.   Return if symptoms worsen or fail to improve, for STI screening.  No future appointments.  Earleen Glazier, [place]

## 2023-08-19 LAB — CHLAMYDIA/GC NAA, CONFIRMATION
Chlamydia trachomatis, NAA: NEGATIVE
Neisseria gonorrhoeae, NAA: NEGATIVE

## 2023-08-21 LAB — GONOCOCCUS CULTURE
